# Patient Record
Sex: Female | Born: 1975 | Smoking: Former smoker
Health system: Southern US, Community
[De-identification: ages and names within clinical notes are randomized; demographics above are authoritative.]

## PROBLEM LIST (undated history)

## (undated) DIAGNOSIS — G4700 Insomnia, unspecified: Secondary | ICD-10-CM

## (undated) DIAGNOSIS — I351 Nonrheumatic aortic (valve) insufficiency: Secondary | ICD-10-CM

## (undated) DIAGNOSIS — Z8742 Personal history of other diseases of the female genital tract: Secondary | ICD-10-CM

## (undated) DIAGNOSIS — B373 Candidiasis of vulva and vagina: Secondary | ICD-10-CM

## (undated) DIAGNOSIS — G2581 Restless legs syndrome: Secondary | ICD-10-CM

## (undated) DIAGNOSIS — G4761 Periodic limb movement disorder: Secondary | ICD-10-CM

## (undated) DIAGNOSIS — F329 Major depressive disorder, single episode, unspecified: Secondary | ICD-10-CM

## (undated) DIAGNOSIS — I4711 Inappropriate sinus tachycardia, so stated: Secondary | ICD-10-CM

## (undated) DIAGNOSIS — F419 Anxiety disorder, unspecified: Secondary | ICD-10-CM

## (undated) DIAGNOSIS — K219 Gastro-esophageal reflux disease without esophagitis: Secondary | ICD-10-CM

## (undated) DIAGNOSIS — R000 Tachycardia, unspecified: Secondary | ICD-10-CM

## (undated) DIAGNOSIS — E559 Vitamin D deficiency, unspecified: Secondary | ICD-10-CM

## (undated) DIAGNOSIS — Z8659 Personal history of other mental and behavioral disorders: Secondary | ICD-10-CM

## (undated) DIAGNOSIS — N941 Unspecified dyspareunia: Secondary | ICD-10-CM

## (undated) DIAGNOSIS — E782 Mixed hyperlipidemia: Secondary | ICD-10-CM

## (undated) DIAGNOSIS — Z8616 Personal history of COVID-19: Secondary | ICD-10-CM

## (undated) DIAGNOSIS — I10 Essential (primary) hypertension: Secondary | ICD-10-CM

## (undated) DIAGNOSIS — G43909 Migraine, unspecified, not intractable, without status migrainosus: Secondary | ICD-10-CM

## (undated) DIAGNOSIS — F411 Generalized anxiety disorder: Secondary | ICD-10-CM

## (undated) DIAGNOSIS — E7800 Pure hypercholesterolemia, unspecified: Secondary | ICD-10-CM

## (undated) DIAGNOSIS — F32A Depression, unspecified: Secondary | ICD-10-CM

## (undated) DIAGNOSIS — N915 Oligomenorrhea, unspecified: Secondary | ICD-10-CM

## (undated) DIAGNOSIS — N390 Urinary tract infection, site not specified: Secondary | ICD-10-CM

## (undated) HISTORY — DX: Oligomenorrhea, unspecified: N91.5

## (undated) HISTORY — PX: OVARIAN CYST REMOVAL: SHX89

## (undated) HISTORY — DX: Vitamin D deficiency, unspecified: E55.9

## (undated) HISTORY — DX: Major depressive disorder, single episode, unspecified: F32.9

## (undated) HISTORY — DX: Essential (primary) hypertension: I10

## (undated) HISTORY — DX: Insomnia, unspecified: G47.00

## (undated) HISTORY — DX: Anxiety disorder, unspecified: F41.9

## (undated) HISTORY — DX: Candidiasis of vulva and vagina: B37.3

## (undated) HISTORY — DX: Depression, unspecified: F32.A

## (undated) HISTORY — DX: Personal history of other diseases of the female genital tract: Z87.42

## (undated) HISTORY — DX: Migraine, unspecified, not intractable, without status migrainosus: G43.909

## (undated) HISTORY — DX: Pure hypercholesterolemia, unspecified: E78.00

## (undated) HISTORY — DX: Urinary tract infection, site not specified: N39.0

---

## 1998-01-23 ENCOUNTER — Ambulatory Visit (HOSPITAL_COMMUNITY): Admission: RE | Admit: 1998-01-23 | Discharge: 1998-01-23 | Payer: Self-pay | Admitting: Obstetrics and Gynecology

## 1998-06-10 ENCOUNTER — Emergency Department (HOSPITAL_COMMUNITY): Admission: EM | Admit: 1998-06-10 | Discharge: 1998-06-10 | Payer: Self-pay | Admitting: Emergency Medicine

## 1998-10-24 ENCOUNTER — Encounter: Payer: Self-pay | Admitting: *Deleted

## 1998-10-24 ENCOUNTER — Inpatient Hospital Stay (HOSPITAL_COMMUNITY): Admission: AD | Admit: 1998-10-24 | Discharge: 1998-10-24 | Payer: Self-pay | Admitting: *Deleted

## 1998-12-23 ENCOUNTER — Inpatient Hospital Stay (HOSPITAL_COMMUNITY): Admission: AD | Admit: 1998-12-23 | Discharge: 1998-12-23 | Payer: Self-pay | Admitting: Obstetrics and Gynecology

## 1999-02-02 ENCOUNTER — Inpatient Hospital Stay (HOSPITAL_COMMUNITY): Admission: AD | Admit: 1999-02-02 | Discharge: 1999-02-02 | Payer: Self-pay | Admitting: Obstetrics and Gynecology

## 1999-02-08 ENCOUNTER — Inpatient Hospital Stay (HOSPITAL_COMMUNITY): Admission: AD | Admit: 1999-02-08 | Discharge: 1999-02-08 | Payer: Self-pay | Admitting: *Deleted

## 1999-05-20 ENCOUNTER — Inpatient Hospital Stay (HOSPITAL_COMMUNITY): Admission: AD | Admit: 1999-05-20 | Discharge: 1999-05-20 | Payer: Self-pay | Admitting: Obstetrics & Gynecology

## 1999-05-22 ENCOUNTER — Inpatient Hospital Stay (HOSPITAL_COMMUNITY): Admission: AD | Admit: 1999-05-22 | Discharge: 1999-05-22 | Payer: Self-pay | Admitting: *Deleted

## 1999-05-24 ENCOUNTER — Inpatient Hospital Stay (HOSPITAL_COMMUNITY): Admission: AD | Admit: 1999-05-24 | Discharge: 1999-05-29 | Payer: Self-pay | Admitting: *Deleted

## 1999-10-14 ENCOUNTER — Other Ambulatory Visit: Admission: RE | Admit: 1999-10-14 | Discharge: 1999-10-14 | Payer: Self-pay | Admitting: Obstetrics and Gynecology

## 2000-07-13 ENCOUNTER — Other Ambulatory Visit: Admission: RE | Admit: 2000-07-13 | Discharge: 2000-07-13 | Payer: Self-pay | Admitting: Obstetrics and Gynecology

## 2000-08-15 DIAGNOSIS — B373 Candidiasis of vulva and vagina: Secondary | ICD-10-CM

## 2000-08-15 DIAGNOSIS — N390 Urinary tract infection, site not specified: Secondary | ICD-10-CM

## 2000-08-15 HISTORY — DX: Candidiasis of vulva and vagina: B37.3

## 2000-08-15 HISTORY — DX: Urinary tract infection, site not specified: N39.0

## 2001-01-25 ENCOUNTER — Inpatient Hospital Stay (HOSPITAL_COMMUNITY): Admission: AD | Admit: 2001-01-25 | Discharge: 2001-01-28 | Payer: Self-pay | Admitting: Obstetrics and Gynecology

## 2001-01-25 ENCOUNTER — Encounter (INDEPENDENT_AMBULATORY_CARE_PROVIDER_SITE_OTHER): Payer: Self-pay | Admitting: Specialist

## 2001-03-19 ENCOUNTER — Inpatient Hospital Stay (HOSPITAL_COMMUNITY): Admission: AD | Admit: 2001-03-19 | Discharge: 2001-03-19 | Payer: Self-pay | Admitting: Obstetrics and Gynecology

## 2001-06-27 ENCOUNTER — Other Ambulatory Visit: Admission: RE | Admit: 2001-06-27 | Discharge: 2001-06-27 | Payer: Self-pay | Admitting: Obstetrics and Gynecology

## 2001-07-15 ENCOUNTER — Emergency Department (HOSPITAL_COMMUNITY): Admission: EM | Admit: 2001-07-15 | Discharge: 2001-07-16 | Payer: Self-pay | Admitting: Emergency Medicine

## 2002-07-10 ENCOUNTER — Other Ambulatory Visit: Admission: RE | Admit: 2002-07-10 | Discharge: 2002-07-10 | Payer: Self-pay | Admitting: Obstetrics and Gynecology

## 2003-07-14 ENCOUNTER — Other Ambulatory Visit: Admission: RE | Admit: 2003-07-14 | Discharge: 2003-07-14 | Payer: Self-pay | Admitting: Obstetrics and Gynecology

## 2004-07-14 ENCOUNTER — Other Ambulatory Visit: Admission: RE | Admit: 2004-07-14 | Discharge: 2004-07-14 | Payer: Self-pay | Admitting: Obstetrics and Gynecology

## 2005-07-18 ENCOUNTER — Other Ambulatory Visit: Admission: RE | Admit: 2005-07-18 | Discharge: 2005-07-18 | Payer: Self-pay | Admitting: Obstetrics and Gynecology

## 2006-01-17 ENCOUNTER — Ambulatory Visit: Payer: Self-pay | Admitting: Family Medicine

## 2006-02-27 ENCOUNTER — Ambulatory Visit: Payer: Self-pay | Admitting: Family Medicine

## 2006-04-14 ENCOUNTER — Ambulatory Visit: Payer: Self-pay | Admitting: Family Medicine

## 2006-06-15 ENCOUNTER — Ambulatory Visit: Payer: Self-pay | Admitting: Family Medicine

## 2006-08-15 DIAGNOSIS — E559 Vitamin D deficiency, unspecified: Secondary | ICD-10-CM

## 2006-08-15 HISTORY — DX: Vitamin D deficiency, unspecified: E55.9

## 2006-09-14 ENCOUNTER — Ambulatory Visit: Payer: Self-pay | Admitting: Family Medicine

## 2006-11-07 ENCOUNTER — Ambulatory Visit: Payer: Self-pay | Admitting: Family Medicine

## 2006-11-16 ENCOUNTER — Ambulatory Visit: Payer: Self-pay | Admitting: Family Medicine

## 2007-03-20 ENCOUNTER — Ambulatory Visit: Payer: Self-pay | Admitting: Family Medicine

## 2007-03-22 ENCOUNTER — Encounter: Admission: RE | Admit: 2007-03-22 | Discharge: 2007-03-22 | Payer: Self-pay | Admitting: Family Medicine

## 2007-08-16 DIAGNOSIS — G4700 Insomnia, unspecified: Secondary | ICD-10-CM

## 2007-08-16 HISTORY — DX: Insomnia, unspecified: G47.00

## 2007-08-21 ENCOUNTER — Ambulatory Visit: Payer: Self-pay | Admitting: Family Medicine

## 2007-10-08 ENCOUNTER — Encounter: Admission: RE | Admit: 2007-10-08 | Discharge: 2007-10-08 | Payer: Self-pay | Admitting: Specialist

## 2007-12-04 ENCOUNTER — Ambulatory Visit: Payer: Self-pay | Admitting: Family Medicine

## 2007-12-05 ENCOUNTER — Emergency Department (HOSPITAL_COMMUNITY): Admission: EM | Admit: 2007-12-05 | Discharge: 2007-12-06 | Payer: Self-pay | Admitting: Emergency Medicine

## 2007-12-06 ENCOUNTER — Ambulatory Visit: Payer: Self-pay | Admitting: Family Medicine

## 2007-12-10 ENCOUNTER — Ambulatory Visit: Payer: Self-pay | Admitting: Family Medicine

## 2007-12-11 ENCOUNTER — Encounter: Admission: RE | Admit: 2007-12-11 | Discharge: 2007-12-11 | Payer: Self-pay | Admitting: Family Medicine

## 2007-12-14 ENCOUNTER — Emergency Department (HOSPITAL_COMMUNITY): Admission: EM | Admit: 2007-12-14 | Discharge: 2007-12-15 | Payer: Self-pay | Admitting: Emergency Medicine

## 2007-12-24 ENCOUNTER — Encounter (INDEPENDENT_AMBULATORY_CARE_PROVIDER_SITE_OTHER): Payer: Self-pay | Admitting: Obstetrics and Gynecology

## 2007-12-24 ENCOUNTER — Observation Stay (HOSPITAL_COMMUNITY): Admission: AD | Admit: 2007-12-24 | Discharge: 2007-12-25 | Payer: Self-pay | Admitting: Obstetrics and Gynecology

## 2007-12-25 ENCOUNTER — Encounter (INDEPENDENT_AMBULATORY_CARE_PROVIDER_SITE_OTHER): Payer: Self-pay | Admitting: Obstetrics and Gynecology

## 2008-01-24 ENCOUNTER — Ambulatory Visit: Payer: Self-pay | Admitting: Family Medicine

## 2008-05-20 ENCOUNTER — Ambulatory Visit: Payer: Self-pay | Admitting: Family Medicine

## 2008-11-20 ENCOUNTER — Ambulatory Visit: Payer: Self-pay | Admitting: Family Medicine

## 2008-12-15 ENCOUNTER — Ambulatory Visit: Payer: Self-pay | Admitting: Family Medicine

## 2009-05-05 ENCOUNTER — Ambulatory Visit: Payer: Self-pay | Admitting: Family Medicine

## 2009-06-30 ENCOUNTER — Ambulatory Visit: Payer: Self-pay | Admitting: Family Medicine

## 2009-07-29 ENCOUNTER — Ambulatory Visit: Payer: Self-pay | Admitting: Family Medicine

## 2009-09-08 ENCOUNTER — Ambulatory Visit: Payer: Self-pay | Admitting: Family Medicine

## 2009-09-24 ENCOUNTER — Ambulatory Visit: Payer: Self-pay | Admitting: Family Medicine

## 2009-09-29 ENCOUNTER — Encounter: Admission: RE | Admit: 2009-09-29 | Discharge: 2009-09-29 | Payer: Self-pay | Admitting: Family Medicine

## 2009-10-13 ENCOUNTER — Ambulatory Visit: Payer: Self-pay | Admitting: Physician Assistant

## 2009-12-30 ENCOUNTER — Ambulatory Visit: Payer: Self-pay | Admitting: Family Medicine

## 2010-01-08 ENCOUNTER — Ambulatory Visit: Payer: Self-pay | Admitting: Family Medicine

## 2010-01-18 ENCOUNTER — Ambulatory Visit: Payer: Self-pay | Admitting: Family Medicine

## 2010-03-08 ENCOUNTER — Ambulatory Visit: Payer: Self-pay | Admitting: Family Medicine

## 2010-07-30 ENCOUNTER — Ambulatory Visit: Payer: Self-pay | Admitting: Family Medicine

## 2010-08-30 ENCOUNTER — Ambulatory Visit
Admission: RE | Admit: 2010-08-30 | Discharge: 2010-08-30 | Payer: Self-pay | Source: Home / Self Care | Attending: Family Medicine | Admitting: Family Medicine

## 2010-09-06 ENCOUNTER — Ambulatory Visit: Admit: 2010-09-06 | Payer: Self-pay | Admitting: Family Medicine

## 2010-12-28 NOTE — H&P (Signed)
NAMESHARIE, Veronica Gates NO.:  [id number]   MEDICAL RECORD NO.:  [id number]          PATIENT TYPE:  EMS   LOCATION:  MAJO                         FACILITY:  [facility]   PHYSICIAN:  Lucita Ferrara, MD         DATE OF BIRTH:  Jun 22, 1976   DATE OF ADMISSION:  12/05/2007  DATE OF DISCHARGE:                              HISTORY & PHYSICAL   PRIMARY CARE PHYSICIAN:  Sharlot Gowda, M.D.   HISTORY OF PRESENT ILLNESS:  The patient is a 35 year old female who  presents to [facility] with dizziness, chest pain,  nausea, tingling and numbness in the bilateral upper and lower  extremities.  She also states that she has got some dysphagia but no  sore throat, and although she can tolerate a regular diet currently, her  symptoms are nonspecific, somatic and generalized.  She denies any  shortness of breath, orthopnea, paroxysmal nocturnal dyspnea, lower  extremity edema.  She currently denies any chest pain.  She has a  history of migraine headaches that are well-controlled.  She attributes  some of her symptoms to an increased dose in her gabapentin.  She also  states that recently she has had a questionable urinary tract infection  which was found to be negative.  Physician's assistant apparently gave  her a dose of Bactrim.  She also attributes her symptoms to that.  Otherwise, her 12-point review of systems is negative.  She denies any  focal neurological deficits.  She denies any vomiting, changes in her  bowel habits, hematemesis, hematochezia.   PAST MEDICAL HISTORY:  1. Migraine headaches.  2. Hyperlipidemia.  3  Depression.   ALLERGIES:  FLOXIN.   MEDICATIONS AT HOME:  1. Gabapentin.  2. Lexapro.  3. Wellbutrin.  4. Tricor.  5. Simvastatin.  6. Vitamin D.   SURGERIES:  None   PHYSICAL EXAMINATION:  GENERAL:  The patient is a 35 year old female in  no acute distress.  VITAL SIGNS:  Blood pressure 130/92, respirations 18, pulse 81 and pulse  ox 100% on  room air.  HEENT:  Normocephalic, atraumatic.  Sclerae anicteric.  PERRLA.  Extraocular muscles intact.  NECK:  Supple.  No JVD or carotid bruits.  CARDIOVASCULAR:  S1-S2 regular rhythm.  No murmurs, rubs or clicks.  ABDOMEN:  Soft, nontender, nondistended.  Positive bowel sounds.  LUNGS:  Clear to auscultation bilaterally.  No rhonchi, rales or  wheezes.  EXTREMITIES:  No clubbing, cyanosis or edema.  NEUROLOGICAL:  Patient is alert and oriented x3, cranial nerves II-XII  grossly intact.  She had an EKG which showed normal sinus rhythm.  No ST-  T wave abnormalities.   LABORATORY RESULTS:  Urinalysis is essentially negative.  Urine  pregnancy test is negative.  Complete metabolic panel shows low  potassium 3.2, glucose 150, BUN 12, creatinine 1.31.  AST/ALT is 23/27  and alk-phos 33.  CBC shows a white count 7.4, hemoglobin 13.1,  hematocrit 37.4 platelets 253.   ASSESSMENT/PLAN:  Patient is a 35 year old with:  1. Chest pain that is really atypical with no EKG changes.  2. Dizziness,  nausea with no vomiting.  3. Diffuse numbness and tingling all over the body  4. History of a migraine headaches.  5. Recent diagnosis of urinary tract infection with a negative      urinalysis.  6. Hyperglycemia.  7. Depression.  8. Hyperlipidemia.   PLAN:  Note decision made by ED physician to DC patient home, given  atypical presentation for CP.      Lucita Ferrara, MD  Electronically Signed     RR/MEDQ  D:  12/06/2007  T:  12/06/2007  Job:  [id number]

## 2010-12-28 NOTE — Op Note (Signed)
NAMELASHANTE, Veronica Gates             ACCOUNT NO.:  [id number]   MEDICAL RECORD NO.:  [id number]          PATIENT TYPE:  OBV   LOCATION:  9309                          FACILITY:  [facility]   PHYSICIAN:  Crist Fat. Rivard, M.D. DATE OF BIRTH:  [birth year]   DATE OF PROCEDURE:  DATE OF DISCHARGE:                               OPERATIVE REPORT   PREOPERATIVE DIAGNOSIS:  Right ovarian cyst with severe pelvic pain.   POSTOPERATIVE DIAGNOSIS:  Right ovarian cyst with severe pelvic pain.   ANESTHESIA:  General.   ANESTHESIOLOGIST:  Dr. Malen Gauze.   PROCEDURE:  Laparoscopy and right ovarian cystectomy.   SURGEON:  Crist Fat. Rivard, M.D.   ASSISTANT:  Cam Hai, C.N.M., certified nurse midwife.   ESTIMATED BLOOD LOSS:  Minimal.   PROCEDURE:  After being informed of the planned procedure with possible  complications including bleeding, infection, injury to bowel, bladder,  ureters, need for laparotomy, and need for oophorectomy.  Informed  consent was obtained.  The patient is taken to OR #4.  Given general  anesthesia with endotracheal intubation without any complication.  She  was placed in the lithotomy position.  Prepped and draped in a sterile  fashion and a Foley catheter is inserted in her bladder.  Pelvic exam  reveals an anteverted uterus normal in size and shape.  A normal left  adnexa and an enlarged mobile right adnexa.  A speculum is inserted.  Anterior lip of the cervix was grasped with tenaculum.  Forceps and an  acorn manipulator was placed in the cervix.  The speculum is removed.   We infiltrate the umbilical area with Marcaine 0.255 mL and perform a  semielliptical incision, which was brought down sharply to the fascia.  The fascia is identified, grasped with Kocher forceps, and incised with  Mayo scissors.  Peritoneum is entered bluntly.  A purse-string suture of  0-Vicryl is placed on the fascia and a 10-mm Hassan trocar is easily  inserted in the incision held in place  with a purse-string suture  previously placed.  This allowed for easy insufflation of  pneumoperitoneum with CO2 at a maximum pressure of 15 mmHg.  A camera is  inserted.  The patient is placed in Trendelenburg and we can now  visualize an anterior cul-de-sac, which is essentially normal except for  some adhesions between the bladder and the lower uterine segment.  A  normal uterus and normal posterior cul-de-sac.  Both tubes appear normal  except for previous site of tubal ligation.  Left ovary is normal.  Right ovary is enlarged with a thin-wall cyst measuring 5-1/2 to 6 cm.  Mobile with no excrescence and no adhesions.  Appendix is not  visualized.  Omentum is visualized and normal.  Liver and gallbladder  are normal.  A little bit of free pelvic fluid clear, is present.  We do  determine placement of 2 trocars.  A 10-mm suprapubic trocar and a 5-mm  right lower quadrant trocar under direct visualization after  infiltrating with Marcaine 0.25.  We then performed pelvic washings.   The right utero-ovarian ligament is grasped and the ovary  is slipped on  top of the uterus, which shows this clearly the site of the thin wall.  Using monopolar scissors, we can make a little breech in the external  capsule of the cyst, and bluntly dissect the cyst away for easy  grasping.  Using a two graspers technique and traction countertraction,  we are able to peel this is completely away from the ovary, which does  release some spillage of clear fluid.  The cyst is removed in its  entirety and sent for pathology.  We then profusely irrigated with warm  saline and note a few sites of oozing at the base of the cyst, which are  easily controlled using bipolar cauterization.  We again profusely  washed and noticed satisfactory hemostasis even after an underwater view  as well as deflating the abdomen to release the pneumoperitoneum.  Instruments are then removed after evacuating the pneumoperitoneum.    The umbilical fascia is closed with the previously placed purse-string  suture of 0-Vicryl and the suprapubic fascia is closed with a figure-of-  eight stitch of 0-Vicryl.  The skin of all incisions is then closed with  subcuticular suture of 3-0 Monocryl and Dermabond.  The intrauterine  manipulator and the tenaculum are removed as well as the Foley catheter.   Instruments and sponge count is complete x2.  Estimated blood loss is  minimal.  The procedure is very well tolerated by the patient who was  taken to recovery room in a well and stable condition.   SPECIMEN:  Pelvic washings sent to cytology and right ovarian cyst wall  sent to pathology.      Crist Fat Rivard, M.D.  Electronically Signed     SAR/MEDQ  D:  12/24/2007  T:  12/25/2007  Job:  [id number]

## 2010-12-31 NOTE — Op Note (Signed)
[facility]  Patient:    Veronica Gates, Veronica Gates                    MRN: [id number] Proc. Date: 01/25/01 Adm. Date:  [id number] Attending:  Dierdre Forth Pearline                           Operative Report  PREOPERATIVE DIAGNOSES:       1. Term intrauterine pregnancy.                               2. Prior cesarean section.                               3. Desires for repeat.                               4. Desire for surgical sterilization.  POSTOPERATIVE DIAGNOSES:      1. Term intrauterine pregnancy.                               2. Prior cesarean section.                               3. Desires for repeat.                               4. Desire for surgical sterilization.                               5. Macrosomia.  SURGERY:                      Repeat low transverse cesarean section and bilateral tubal sterilization.  SURGEON:                      Vanessa P. Pennie Rushing, M.D.  FIRST ASSISTANT:              Marcelle Smiling. Clelia Croft, C.N.M.  ANESTHESIA:                   Spinal.  ESTIMATED BLOOD LOSS:         750 cc.  COMPLICATIONS:                None.  FINDINGS:                     The patient was delivered of a female infant, whose name is Veronica Gates, weighing 9 lb 14 oz with Apgars of 8 and 9 at one and five minutes respectively.  The uterus, tubes and ovaries were normal for the gravid state.  DESCRIPTION OF PROCEDURE:     The patient was taken to the operating room after appropriate identification and placed on the operating table.  After attaining adequate spinal anesthesia, she was placed in the supine position with a left lateral tilt.  The abdomen and perineum were prepped with multiple layers of Betadine and a Foley catheter inserted into the bladder and connected to straight drainage.  The abdomen was draped as a sterile field. After assurance of adequate anesthesia, a transverse incision was made at the site of the previous  cesarean section incision and the abdomen opened in layers.  The peritoneum was entered.  The uterus was incised approximately 1 cm above the uterovesical fold and the incision enlarged bluntly.  The infant was delivered from the occiput transverse position with the aid of a Kiwi vacuum extractor and, after having the nares and pharynx suctioned and the cord clamped and cut, was handed off to the awaiting pediatricians.  The appropriate cord blood was drawn and the placenta noted to have separated from the uterus.  It was then removed and the uterine incision closed with a running interlocking suture of 0 Vicryl.  An imbricating suture of 0 Vicryl was placed.  Hemostatic sutures of 0 Vicryl allowed for adequate hemostasis. The left fallopian tube was then identified, followed to its fimbriated end, the grasped at the isthmic portion and elevated.  A suture of 2-0 chromic was placed through the mesosalpinx and tied fore and aft on a knuckle of tube.  A second suture ligature was placed proximal to that and the intervening knuckle of tube excised.  The cut ends were cauterized.  A similar procedure was carried out on the opposite side and hemostasis noted to be adequate.  A single suture of 2-0 Vicryl was placed in the uterine serosa to repair that incision and copious irrigation carried out with adequate hemostasis noted. The abdominal peritoneum was then closed with a running suture of 2-0 Vicryl. The rectus muscles were reapproximated in the midline with a figure-of-eight suture of 2-0 Vicryl.  The rectus muscles were noted to be hemostatic and irrigated.  The rectus fascia was closed with a running suture of 0 Vicryl and then reinforced on either side of midline with figure-of-eight sutures of 0 Vicryl.  The subcutaneous tissue was irrigated and made hemostatic with Bovie cautery.  Skin staples were applied to the skin incision.  A sterile dressing was applied.  The patient was taken from  the operating room to the recovery room in satisfactory condition, having tolerated the procedure well, with sponge and instrument counts correct.  The infant was taken to the full-term nursery. DD:  01/25/01 TD:  01/25/01 Job: [id number]
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
## 2010-12-31 NOTE — Discharge Summary (Signed)
[facility]  Patient:    Veronica Gates, Veronica Gates                    MRN: [id number] Adm. Date:  [id number] Disc. Date: 01/28/01 Attending:  Shaune Spittle Dictator:   Mack Guise, C.N.M.                           Discharge Summary  ADMISSION DIAGNOSIS:          Term pregnancy; desires repeat C-section, desires sterilization.  DISCHARGE DIAGNOSES:          1. Term pregnancy delivered primary low                                  transverse cesarean delivery.                               2. Postpartum bilateral tubal sterilization.  PROCEDURES:                   1. Primary low transverse cesarean delivery.                               2. Bilateral tubal sterilization.  HISTORY OF PRESENT ILLNESS:   Ms. Asante is a 35 year old gravida 3, para 2-0-0-2 who presents for elective repeat C-section with increasingly labile blood pressures.  She underwent primary low transverse cesarean delivery with the birth of an 8 pound 14 ounce female infant named Earna Coder with Apgar scores of 8 at one minute and 9 at five minutes by Dr. Dierdre Forth. Her hemoglobin on the first postop day was 9.8 and her blood pressures have remained stable.  This is her third postop day and she is judged to be in satisfactory condition for discharge.  DISCHARGE MEDICATIONS:        1. Motrin 600 mg p.o. q.6h. p.r.n. pain.                               2. Tylox 1-2 p.o. q.3-4h. pain.                               3. Prenatal vitamins.  FOLLOW-UP:                    Follow-up at [facility] in six weeks. DD:  01/28/01 TD:  01/28/01 Job: [id number]

## 2011-05-10 LAB — DIFFERENTIAL
Basophils Absolute: 0
Basophils Relative: 0
Eosinophils Absolute: 0.1
Eosinophils Relative: 1
Lymphocytes Relative: 28
Lymphs Abs: 2.1
Monocytes Absolute: 0.6
Monocytes Relative: 8
Neutro Abs: 4.7
Neutrophils Relative %: 63

## 2011-05-10 LAB — URINALYSIS, ROUTINE W REFLEX MICROSCOPIC
Bilirubin Urine: NEGATIVE
Glucose, UA: NEGATIVE
Hgb urine dipstick: NEGATIVE
Ketones, ur: NEGATIVE
Nitrite: NEGATIVE
Protein, ur: NEGATIVE
Specific Gravity, Urine: 1.015
Urobilinogen, UA: 0.2
pH: 7.5

## 2011-05-10 LAB — CBC
HCT: 37.4
Hemoglobin: 13.1
MCHC: 35
MCV: 92.2
Platelets: 253
RBC: 4.06
RDW: 12.1
WBC: 7.4

## 2011-05-10 LAB — PREGNANCY, URINE: Preg Test, Ur: NEGATIVE

## 2011-05-10 LAB — COMPREHENSIVE METABOLIC PANEL
ALT: 27
AST: 23
Albumin: 3.9
Alkaline Phosphatase: 33 — ABNORMAL LOW
Calcium: 9.7
GFR calc Af Amer: 57 — ABNORMAL LOW
Glucose, Bld: 150 — ABNORMAL HIGH
Potassium: 3.2 — ABNORMAL LOW
Sodium: 139
Total Bilirubin: 0.4
Total Protein: 6.3

## 2011-05-10 LAB — COMPREHENSIVE METABOLIC PANEL WITH GFR
BUN: 12
CO2: 25
Chloride: 104
Creatinine, Ser: 1.31 — ABNORMAL HIGH
GFR calc non Af Amer: 47 — ABNORMAL LOW

## 2011-05-11 LAB — CBC
HCT: 35 — ABNORMAL LOW
HCT: 36.5
Hemoglobin: 12.1
Hemoglobin: 12.5
MCHC: 34.3
MCHC: 34.6
MCV: 92.3
MCV: 92.7
Platelets: 251
Platelets: 280
RBC: 3.77 — ABNORMAL LOW
RBC: 3.95
RDW: 11.9
RDW: 12.1
WBC: 10.3
WBC: 8.1

## 2012-01-19 ENCOUNTER — Encounter: Payer: Self-pay | Admitting: Obstetrics and Gynecology

## 2012-01-19 ENCOUNTER — Ambulatory Visit (INDEPENDENT_AMBULATORY_CARE_PROVIDER_SITE_OTHER): Payer: 59 | Admitting: Obstetrics and Gynecology

## 2012-01-19 VITALS — BP 122/80 | Ht 70.0 in | Wt 193.0 lb

## 2012-01-19 DIAGNOSIS — Z01419 Encounter for gynecological examination (general) (routine) without abnormal findings: Secondary | ICD-10-CM

## 2012-01-19 DIAGNOSIS — N912 Amenorrhea, unspecified: Secondary | ICD-10-CM

## 2012-01-19 DIAGNOSIS — N926 Irregular menstruation, unspecified: Secondary | ICD-10-CM | POA: Insufficient documentation

## 2012-01-19 LAB — POCT URINE PREGNANCY: Preg Test, Ur: NEGATIVE

## 2012-01-19 MED ORDER — MEDROXYPROGESTERONE ACETATE 5 MG PO TABS: ORAL_TABLET | ORAL | Status: DC

## 2012-01-19 NOTE — Progress Notes (Signed)
Last Pap: 2011 due 2014 WNL: Yes Regular Periods:no Contraception: none  Monthly Breast exam:yes Tetanus<2yrs:yes Nl.Bladder Function:yes Daily BMs:yes Healthy Diet:yes Calcium:yes Mammogram:no Exercise:yes Seatbelt: yes Abuse at home: no Stressful work:no Sigmoid-colonoscopy: n/a Bone Density: No  Pt states no cycle since 12/12 pt absolutely certain she is not pregnant.  Has ling hx of irregular cycles  Subjective:    Veronica Gates is a 36 y.o. female, No obstetric history on file., who presents for an annual exam.     History   Social History  . Marital Status: Married    Spouse Name: N/A    Number of Children: N/A  . Years of Education: N/A   Social History Main Topics  . Smoking status: Not on file  . Smokeless tobacco: Not on file  . Alcohol Use: Not on file  . Drug Use: Not on file  . Sexually Active: Not on file   Other Topics Concern  . Not on file   Social History Narrative  . No narrative on file    Menstrual cycle:   LMP: Patient's last menstrual period was 07/21/2011.           Cycle: monthly for the 12 months of 2012.  None since 12/12.  No significant abdominal pain. The following portions of the patient's history were reviewed and updated as appropriate: allergies, current medications, past family history, past medical history, past social history, past surgical history and problem list.  Review of Systems Pertinent items are noted in HPI. Breast:Negative for breast lump,nipple discharge or nipple retraction Gastrointestinal: Negative for abdominal pain, change in bowel habits or rectal bleeding Urinary:negative Has hx of ovarian cysts in the past Had nl TSH with PCP in March   Objective:    BP 122/80  Ht 5\' 10"  (1.778 m)  Wt 193 lb (87.544 kg)  BMI 27.69 kg/m2  LMP 07/21/2011    Weight:  Wt Readings from Last 1 Encounters:  01/19/12 193 lb (87.544 kg) Wt 1188 last year          BMI: Body mass index is 27.69 kg/(m^2).  General  Appearance: Alert, appropriate appearance for age. No acute distress HEENT: Grossly normal Neck / Thyroid: Supple, no masses, nodes or enlargement Lungs: clear to auscultation bilaterally Back: No CVA tenderness Breast Exam: No masses or nodes.No dimpling, nipple retraction or discharge. Cardiovascular: Regular rate and rhythm. S1, S2, no murmur Gastrointestinal: Soft, non-tender, no masses or organomegaly Pelvic Exam: External genitalia: normal general appearance Vaginal: normal mucosa without prolapse or lesions Cervix: normal appearance Adnexa: fullness in Right adnexum,. ? cystic.  Left no masses Uterus: ULNS Rectovaginal: normal rectal, no masses Lymphatic Exam: Non-palpable nodes in neck, clavicular, axillary, or inguinal regions Skin: no rash or abnormalities Neurologic: Normal gait and speech, no tremor  Psychiatric: Alert and oriented, appropriate affect.   Wet Prep:not applicable Urinalysis:not applicable UPT: Pt will perform at home, Not done   Assessment:    history of irregular menses, now on menorrhea it for 6 months History of ovarian cyst with fullness in the right adnexum Verbal report of borderline thyroid study Difficulty maintaining weight loss  Plan:    Pap smear is due in 2014 Ultrasound to rule out ovarian cyst.  followup at that Provera 5 mg p.o. Q.d. For 5 days to be used at any time of 3 months or more of menorrhea after doing a pregnancy test at home} STD screening: declined Contraception:bilateral tubal ligation Release of information for the thyroid studies done  by PCP      [facility] PMD

## 2012-01-19 NOTE — Patient Instructions (Signed)
Provera 5 mg by mouth daily for 5 days. Call if no menses within 2 weeks of completing Provera. Repeat Provera dose after 3 months of no cycle, once a home pregnancy test has come back negative

## 2012-02-09 ENCOUNTER — Other Ambulatory Visit: Payer: Self-pay | Admitting: Obstetrics and Gynecology

## 2012-02-09 ENCOUNTER — Ambulatory Visit (INDEPENDENT_AMBULATORY_CARE_PROVIDER_SITE_OTHER): Payer: Commercial Indemnity | Admitting: Obstetrics and Gynecology

## 2012-02-09 ENCOUNTER — Encounter: Payer: Self-pay | Admitting: Obstetrics and Gynecology

## 2012-02-09 ENCOUNTER — Ambulatory Visit (INDEPENDENT_AMBULATORY_CARE_PROVIDER_SITE_OTHER): Payer: Commercial Indemnity

## 2012-02-09 VITALS — BP 100/70 | Temp 99.4°F | Ht 69.34 in | Wt 190.0 lb

## 2012-02-09 DIAGNOSIS — K5900 Constipation, unspecified: Secondary | ICD-10-CM

## 2012-02-09 DIAGNOSIS — N912 Amenorrhea, unspecified: Secondary | ICD-10-CM

## 2012-02-09 DIAGNOSIS — R140 Abdominal distension (gaseous): Secondary | ICD-10-CM

## 2012-02-09 DIAGNOSIS — R141 Gas pain: Secondary | ICD-10-CM

## 2012-02-09 DIAGNOSIS — N926 Irregular menstruation, unspecified: Secondary | ICD-10-CM

## 2012-02-09 MED ORDER — PROGESTERONE MICRONIZED 200 MG PO CAPS: 200.0000 mg | ORAL_CAPSULE | Freq: Every day | ORAL | Status: DC

## 2012-02-09 NOTE — Patient Instructions (Signed)
OTC Miralax daily for constipation

## 2012-02-09 NOTE — Progress Notes (Signed)
Subjective: Pt c/o a continuous dull aching pain under her right ribcage since 03/07/12. Pt c/o severe nausea daily since. Pt took Ibuprofen 800mg  x 1 dose on 03/07/12 without improvement. C/o occ difficulty breathing due to pain.  The patient has chronic constipation and has had significant abdominal bloating over of the time frame during which she has not had her menses. Withdrawal menses after Provera LMP-01/28/12 x 1 day only.  Objective: BP 100/70  Temp 99.4 F (37.4 C)  Ht 5' 9.34" (1.761 m)  Wt 190 lb (86.183 kg)  BMI 27.78 kg/m2  LMP 01/28/2012 The abdomen is soft without masses or organomegaly or tenderness Ultrasound: The uterus measures 9.14 x 5.06 x 6.07 cm. The endometrium is 7 mm. A C-section scar is noted. There were no uterine masses the right ovary is within normal limits with the appearance of a resolving cyst. The left ovary is within normal limits and since high in the left adnexum near the uterus. There is a small amount of cul-de-sac fluid which could be considered within normal limits.  Impression: Long history of oligomenorrhea. Minimal response to 5 days of Provera Abdominal bloating. Chronic constipation.  Recommendation: MiraLax daily to improve bowel function Prometrium 200 mg at bedtime for the next 14 days starting on July 1 Return to office in 8 weeks to evaluate GI symptoms and menses

## 2012-02-10 DIAGNOSIS — K5900 Constipation, unspecified: Secondary | ICD-10-CM | POA: Insufficient documentation

## 2012-02-10 DIAGNOSIS — R140 Abdominal distension (gaseous): Secondary | ICD-10-CM | POA: Insufficient documentation

## 2012-04-03 DIAGNOSIS — N390 Urinary tract infection, site not specified: Secondary | ICD-10-CM | POA: Insufficient documentation

## 2012-04-03 DIAGNOSIS — B3731 Acute candidiasis of vulva and vagina: Secondary | ICD-10-CM | POA: Insufficient documentation

## 2012-04-03 DIAGNOSIS — N915 Oligomenorrhea, unspecified: Secondary | ICD-10-CM | POA: Insufficient documentation

## 2012-04-03 DIAGNOSIS — F329 Major depressive disorder, single episode, unspecified: Secondary | ICD-10-CM | POA: Insufficient documentation

## 2012-04-03 DIAGNOSIS — B373 Candidiasis of vulva and vagina: Secondary | ICD-10-CM | POA: Insufficient documentation

## 2012-04-03 DIAGNOSIS — G43909 Migraine, unspecified, not intractable, without status migrainosus: Secondary | ICD-10-CM | POA: Insufficient documentation

## 2012-04-03 DIAGNOSIS — I10 Essential (primary) hypertension: Secondary | ICD-10-CM | POA: Insufficient documentation

## 2012-04-03 DIAGNOSIS — Z8742 Personal history of other diseases of the female genital tract: Secondary | ICD-10-CM | POA: Insufficient documentation

## 2012-04-03 DIAGNOSIS — F32A Depression, unspecified: Secondary | ICD-10-CM | POA: Insufficient documentation

## 2012-04-03 DIAGNOSIS — F419 Anxiety disorder, unspecified: Secondary | ICD-10-CM | POA: Insufficient documentation

## 2012-04-03 DIAGNOSIS — G4700 Insomnia, unspecified: Secondary | ICD-10-CM | POA: Insufficient documentation

## 2012-04-03 DIAGNOSIS — E559 Vitamin D deficiency, unspecified: Secondary | ICD-10-CM | POA: Insufficient documentation

## 2012-04-03 DIAGNOSIS — E7800 Pure hypercholesterolemia, unspecified: Secondary | ICD-10-CM | POA: Insufficient documentation

## 2012-04-05 ENCOUNTER — Ambulatory Visit (INDEPENDENT_AMBULATORY_CARE_PROVIDER_SITE_OTHER): Payer: Commercial Indemnity | Admitting: Obstetrics and Gynecology

## 2012-04-05 ENCOUNTER — Encounter: Payer: Self-pay | Admitting: Obstetrics and Gynecology

## 2012-04-05 VITALS — BP 112/72 | Ht 70.0 in | Wt 193.0 lb

## 2012-04-05 DIAGNOSIS — N926 Irregular menstruation, unspecified: Secondary | ICD-10-CM

## 2012-04-05 DIAGNOSIS — N912 Amenorrhea, unspecified: Secondary | ICD-10-CM

## 2012-04-05 MED ORDER — PROGESTERONE MICRONIZED 200 MG PO CAPS: 200.0000 mg | ORAL_CAPSULE | Freq: Every day | ORAL | Status: DC

## 2012-04-05 NOTE — Progress Notes (Signed)
GYN PROBLEM VISIT  [name] is a 36 y.o. year old female,G3P3, who presents for a problem visit.   Subjective:  Pt here to f/u on GI sx and menses. Pt states that she is not having GI issues anymore. Also began cycle on 03/31/2012 as a spontaneous cycle.  Had withdrawal from Prometrium in July.  Still has only weekly BMs, but doesn't want to take daily Miralax. Declines pelvic exam.  Objective:  BP 112/72  Ht 5\' 10"  (1.778 m)  Wt 193 lb (87.544 kg)  BMI 27.69 kg/m2  LMP 03/31/2012; continues through today    Assessment: Oligomenorrhea probably secondary to PCOS variant GI bloating from combination of oligomenorrhea and constipation  Plan: Prometrium every 6 weeks prn amenorrhea Miralax prn constipation Followup Jan 2014 for aex Will do TSH then if not done in last 5 years by PCP ROI for labs from PCP.       Dierdre Forth, MD  04/05/2012 8:38 AM

## 2012-07-17 ENCOUNTER — Telehealth: Payer: Self-pay | Admitting: Obstetrics and Gynecology

## 2012-07-18 NOTE — Telephone Encounter (Signed)
CHANDRA, I SENT [facility] A NOTE OF THIS PT TO SEE WHAT TO DO ABOUT AMENORRHEA. PLEASE MAKE SURE SHE SEES MESSAGE.

## 2012-07-25 ENCOUNTER — Telehealth: Payer: Self-pay

## 2012-07-25 DIAGNOSIS — N912 Amenorrhea, unspecified: Secondary | ICD-10-CM

## 2012-07-25 NOTE — Telephone Encounter (Signed)
Tc to pt per message rgdg amenorrhea after taking Progsterone as pres. Consulted with [facility], pt to have FSH and Estradiol done. Will follow up with pt for POC after labs reviewed. Pt voices understanding. Appt sched 07/27/12 @ 2:00p with lab only.

## 2012-07-27 ENCOUNTER — Other Ambulatory Visit: Payer: Commercial Indemnity

## 2012-07-27 DIAGNOSIS — N912 Amenorrhea, unspecified: Secondary | ICD-10-CM

## 2012-07-28 LAB — FOLLICLE STIMULATING HORMONE: FSH: 6.8 m[IU]/mL

## 2012-07-28 LAB — ESTRADIOL: Estradiol: 43.7 pg/mL

## 2012-08-14 ENCOUNTER — Encounter: Payer: Commercial Indemnity | Admitting: Obstetrics and Gynecology

## 2012-09-13 ENCOUNTER — Ambulatory Visit: Payer: Commercial Indemnity | Admitting: Obstetrics and Gynecology

## 2012-09-13 ENCOUNTER — Encounter: Payer: Self-pay | Admitting: Obstetrics and Gynecology

## 2012-09-13 VITALS — BP 106/64 | Temp 99.1°F | Wt 194.0 lb

## 2012-09-13 DIAGNOSIS — N390 Urinary tract infection, site not specified: Secondary | ICD-10-CM

## 2012-09-13 DIAGNOSIS — R140 Abdominal distension (gaseous): Secondary | ICD-10-CM

## 2012-09-13 DIAGNOSIS — N912 Amenorrhea, unspecified: Secondary | ICD-10-CM

## 2012-09-13 DIAGNOSIS — R399 Unspecified symptoms and signs involving the genitourinary system: Secondary | ICD-10-CM

## 2012-09-13 DIAGNOSIS — N915 Oligomenorrhea, unspecified: Secondary | ICD-10-CM

## 2012-09-13 LAB — POCT URINALYSIS DIPSTICK
Bilirubin, UA: NEGATIVE
Glucose, UA: NEGATIVE
Ketones, UA: NEGATIVE
Nitrite, UA: POSITIVE
Protein, UA: NEGATIVE
Spec Grav, UA: 1.01
Urobilinogen, UA: NEGATIVE
pH, UA: 5

## 2012-09-13 MED ORDER — SULFAMETHOXAZOLE-TRIMETHOPRIM 800-160 MG PO TABS: 1.0000 | ORAL_TABLET | Freq: Two times a day (BID) | ORAL | Status: AC

## 2012-09-13 MED ORDER — NITROFURANTOIN MACROCRYSTAL 50 MG PO CAPS: 50.0000 mg | ORAL_CAPSULE | Freq: Every evening | ORAL | Status: DC | PRN

## 2012-09-13 NOTE — Progress Notes (Signed)
GYN PROBLEM VISIT  [name] is a 37 y.o. year old female,G3P3, who presents for a problem visit.   Subjective: No menses after taking provera for 5 days in the fall or prometrium for 12 days in November.  Had spontaneous menses 08/27/12 lasting 6 days of heavy bleeding without cramps.  Pt c/o of abdominal bloating and wt gain during times of prolonged amenorrhea, with relief of all sx once menses begin. Now has 1 wk hx of dysuria.  Has had recurrent UTI's associated with intercourse  Objective:  BP 106/64  Wt 194 lb (87.998 kg)  LMP 08/27/2012   GI: soft, non-tender; bowel sounds normal; no masses,  no organomegaly  Vulva and vagina appear normal. Bimanual exam reveals normal uterus and adnexa.  U/A:  Positive nitrites  Assessment: UTI Recurrent UTIs  Oligomenorrhea, now s/p spontaneous menses  Plan: If no menses by 11/27/12, pt to take Provera for 5 days.  If no menses, she is to call for U/S to look at endometrial thickness Septra D/S for 3 days Urine c&s Nitrofurantoin 50 mg hs prn intercourse F/U 5/14 for aex or prn   Dierdre Forth, MD  09/13/2012 10:45 AM

## 2012-09-15 LAB — URINE CULTURE: Colony Count: >=100000

## 2012-09-17 ENCOUNTER — Other Ambulatory Visit: Payer: Self-pay

## 2012-09-17 DIAGNOSIS — N390 Urinary tract infection, site not specified: Secondary | ICD-10-CM

## 2012-10-08 ENCOUNTER — Other Ambulatory Visit: Payer: Commercial Indemnity

## 2012-10-09 ENCOUNTER — Other Ambulatory Visit: Payer: Commercial Indemnity

## 2012-10-09 DIAGNOSIS — N390 Urinary tract infection, site not specified: Secondary | ICD-10-CM

## 2012-10-11 LAB — URINE CULTURE
Colony Count: NO GROWTH
Organism ID, Bacteria: NO GROWTH

## 2014-02-04 ENCOUNTER — Other Ambulatory Visit: Payer: Self-pay | Admitting: Obstetrics and Gynecology

## 2014-02-04 DIAGNOSIS — Z1231 Encounter for screening mammogram for malignant neoplasm of breast: Secondary | ICD-10-CM

## 2014-05-26 ENCOUNTER — Ambulatory Visit
Admission: RE | Admit: 2014-05-26 | Discharge: 2014-05-26 | Disposition: A | Discharge status: Home / Self Care | Payer: BC Managed Care – PPO | Source: Ambulatory Visit | Attending: Obstetrics and Gynecology | Admitting: Obstetrics and Gynecology

## 2014-05-26 DIAGNOSIS — Z1231 Encounter for screening mammogram for malignant neoplasm of breast: Secondary | ICD-10-CM

## 2014-06-16 ENCOUNTER — Encounter: Payer: Self-pay | Admitting: Obstetrics and Gynecology

## 2015-10-01 ENCOUNTER — Institutional Professional Consult (permissible substitution): Payer: Self-pay | Admitting: Internal Medicine

## 2015-10-01 ENCOUNTER — Encounter: Payer: Self-pay | Admitting: Internal Medicine

## 2015-10-01 ENCOUNTER — Ambulatory Visit (INDEPENDENT_AMBULATORY_CARE_PROVIDER_SITE_OTHER): Payer: BLUE CROSS/BLUE SHIELD | Admitting: Internal Medicine

## 2015-10-01 VITALS — BP 120/90 | HR 75 | Ht 69.5 in | Wt 172.0 lb

## 2015-10-01 DIAGNOSIS — R05 Cough: Secondary | ICD-10-CM

## 2015-10-01 DIAGNOSIS — R058 Other specified cough: Secondary | ICD-10-CM

## 2015-10-01 DIAGNOSIS — K219 Gastro-esophageal reflux disease without esophagitis: Secondary | ICD-10-CM | POA: Insufficient documentation

## 2015-10-01 MED ORDER — PANTOPRAZOLE SODIUM 40 MG PO TBEC: 40.0000 mg | DELAYED_RELEASE_TABLET | Freq: Every day | ORAL | Status: DC

## 2015-10-01 MED ORDER — FAMOTIDINE 20 MG PO TABS: ORAL_TABLET | ORAL | Status: DC

## 2015-10-01 MED ORDER — PREDNISONE 10 MG PO TABS: ORAL_TABLET | ORAL | Status: DC

## 2015-10-01 MED ORDER — TRAMADOL HCL 50 MG PO TABS: ORAL_TABLET | ORAL | Status: DC

## 2015-10-01 MED ORDER — ACETAMINOPHEN-CODEINE #3 300-30 MG PO TABS: ORAL_TABLET | ORAL | Status: DC

## 2015-10-01 NOTE — Assessment & Plan Note (Signed)
The most common causes of chronic cough in immunocompetent adults include the following: upper airway cough syndrome (UACS), previously referred to as postnasal drip syndrome (PNDS), which is caused by variety of rhinosinus conditions; (2) asthma; (3) GERD; (4) chronic bronchitis from cigarette smoking or other inhaled environmental irritants; (5) nonasthmatic eosinophilic bronchitis; and (6) bronchiectasis.   These conditions, singly or in combination, have accounted for up to 94% of the causes of chronic cough in prospective studies.   Other conditions have constituted no >6% of the causes in prospective studies These have included bronchogenic carcinoma, chronic interstitial pneumonia, sarcoidosis, left ventricular failure, ACEI-induced cough, and aspiration from a condition associated with pharyngeal dysfunction.    Chronic cough is often simultaneously caused by more than one condition. A single cause has been found from 38 to 82% of the time, multiple causes from 18 to 62%. Multiply caused cough has been the result of three diseases up to 42% of the time.       Based on hx and exam, this is most likely:  Classic Upper airway cough syndrome, so named because it's frequently impossible to sort out how much is  CR/sinusitis with freq throat clearing (which can be related to primary GERD)   vs  causing  secondary (" extra esophageal")  GERD from wide swings in gastric pressure that occur with throat clearing, often  promoting self use of mint and menthol lozenges that reduce the lower esophageal sphincter tone and exacerbate the problem further in a cyclical fashion.   These are the same pts (now being labeled as having "irritable larynx syndrome" by some cough centers) who not infrequently have a history of having failed to tolerate ace inhibitors,  dry powder inhalers or biphosphonates or report having atypical reflux symptoms that don't respond to standard doses of PPI , and are easily confused as  having aecopd or asthma flares by even experienced allergists/ pulmonologists.   The first step is to maximize acid suppression and eliminate cyclical coughing then regroup if the cough persists.  I had an extended discussion with the patient reviewing all relevant studies completed to date and  lasting 35/60  min  1) Explained: The standardized cough guidelines published in Chest by Richard Irwin in 2006 are still the best available and consist of a multiple step process (up to 12!) , not a single office visit,  and are intended  to address this problem logically,  with an alogrithm dependent on response to empiric treatment at  each progressive step  to determine a specific diagnosis with  minimal addtional testing needed. Therefore if adherence is an issue or can't be accurately verified,  it's very unlikely the standard evaluation and treatment will be successful here.    Furthermore, response to therapy (other than acute cough suppression, which should only be used short term with avoidance of narcotic containing cough syrups if possible), can be a gradual process for which the patient may perceive immediate benefit.  Unlike going to an eye doctor where the best perscription is almost always the first one and is immediately effective, this is almost never the case in the management of chronic cough syndromes. Therefore the patient needs to commit up front to consistently adhere to recommendations  for up to 6 weeks of therapy directed at the likely underlying problem(s) before the response can be reasonably evaluated.     2) Each maintenance medication was reviewed in detail including most importantly the difference between maintenance and prns and under   what circumstances the prns are to be triggered using an action plan format that is not reflected in the computer generated alphabetically organized AVS.    Please see instructions for details which were reviewed in writing and the patient given  a copy highlighting the part that I personally wrote and discussed at today's ov.   See instructions for specific recommendations which were reviewed directly with the patient who was given a copy with highlighter outlining the key components.   

## 2015-10-01 NOTE — Assessment & Plan Note (Signed)
Explained the natural history of uri and why it's necessary in patients at risk to treat GERD aggressively - at least  short term -   to reduce risk of evolving cyclical cough initially  triggered by epithelial injury and a heightened sensitivty to the effects of any upper airway irritants,  most importantly acid - related - then perpetuated by epithelial injury related to the cough itself as the upper airway collapses on itself.  That is, the more sensitive the epithelium becomes once it is damaged by the virus, the more the ensuing irritability> the more the cough, the more the secondary reflux (especially in those prone to reflux) the more the irritation of the sensitive mucosa and so on in a  Classic cyclical pattern.    She is prone to this in the future and I would be happy to see her back if needing more than otcs to control the problem

## 2015-10-01 NOTE — Progress Notes (Signed)
Subjective:    Patient ID: Veronica Gates, female    DOB: 1975-12-20,    MRN: [id number]  HPI  40 yow quit smoking 2001 with seasonal rhinitis fall as teenager mostly with otc and no assoc cough with tendency to bad bronchitis every year but less as adult  then [place] trip early November 2016 with/in a week sore throat / cough > [facility] augmentin by Gyn > no better so got depo with a week's worth of no cough then back since so referred to pulmonary clinic 10/01/2015 by Dr Cyndia Bent p no better on prednisone/ gerd rx    10/01/2015 1st [facility] office visit/ Javontae Marlette  / on gerd rx since age 13  Chief Complaint  Patient presents with  . Pulmonary Consult    Referred by Dr. Antony Haste. Pt c/o cough x 4 months. Cough is non prod and is esp worse at night and with exertion. She has occ CP after a coughing spell. She gets SOB with minimal exertion such as taking a shower.   cough is more dry than wet/ worse at hs with sensation of tickle  Only sob with cough/ hurts diffusely over anterior chest just with coughing fits/ no vomit/ gagging  No obvious other patterns in day to day or daytime variabilty or assoc cp or chest tightness, subjective wheeze overt sinus or hb symptoms. No unusual exp hx or h/o childhood pna/ asthma or knowledge of premature birth.   Sleeping ok without nocturnal  or early am exacerbation  of respiratory  c/o's or need for noct saba. Also denies any obvious fluctuation of symptoms with weather or environmental changes or other aggravating or alleviating factors except as outlined above   Current Medications, Allergies, Complete Past Medical History, Past Surgical History, Family History, and Social History were reviewed in [employer] record.             Review of Systems  Constitutional: Negative for fever, chills and unexpected weight change.  HENT: Negative for congestion, dental problem, ear pain, nosebleeds, postnasal drip,  rhinorrhea, sinus pressure, sneezing, sore throat, trouble swallowing and voice change.   Eyes: Negative for visual disturbance.  Respiratory: Positive for cough and shortness of breath. Negative for choking.   Cardiovascular: Positive for chest pain. Negative for leg swelling.  Gastrointestinal: Negative for vomiting, abdominal pain and diarrhea.  Genitourinary: Negative for difficulty urinating.       Acid heartburn  Musculoskeletal: Negative for arthralgias.  Skin: Negative for rash.  Neurological: Negative for tremors, syncope and headaches.  Hematological: Does not bruise/bleed easily.       Objective:   Physical Exam   amb slt hoarse wf nad  Wt Readings from Last 3 Encounters:  10/01/15 172 lb (78.019 kg)  09/13/12 194 lb (87.998 kg)  04/05/12 193 lb (87.544 kg)    Vital signs reviewed   HEENT: nl dentition, turbinates, and oropharynx. Nl external ear canals without cough reflex   NECK :  without JVD/Nodes/TM/ nl carotid upstrokes bilaterally   LUNGS: no acc muscle use,  Nl contour chest which is clear to A and P bilaterally without cough on insp or exp maneuvers   CV:  RRR  no s3 or murmur or increase in P2, no edema   ABD:  soft and nontender with nl inspiratory excursion in the supine position. No bruits or organomegaly, bowel sounds nl  MS:  Nl gait/ ext warm without deformities, calf tenderness, cyanosis or clubbing  No obvious joint restrictions   SKIN: warm and dry without lesions    NEURO:  alert, approp, nl sensorium with  no motor deficits     I personally reviewed images and agree with radiology impression as follows:  CXR:  07/11/15 wnl      Assessment & Plan:

## 2015-10-01 NOTE — Patient Instructions (Addendum)
The key to effective treatment for your cough is eliminating the non-stop cycle of cough you're stuck in long enough to let your airway heal completely and then see if there is anything still making you cough once you stop the cough suppression, but this should take no more than 5 days to figure out  First take delsym two tsp every 12 hours and supplement if needed with  Tylenol #3   up to 1-2 every 4 hours to suppress the urge to cough at all or even clear your throat. Swallowing water or using ice chips/non mint and menthol containing candies (such as lifesavers or sugarless jolly ranchers) are also effective.  You should rest your voice and avoid activities that you know make you cough.  Once you have eliminated the cough for 3 straight days try reducing the tylenol #3 first,  then the delsym as tolerated.    Prednisone 10 mg take  4 each am x 2 days,   2 each am x 2 days,  1 each am x 2 days and stop (this is to eliminate allergies and inflammation from coughing)  Protonix (pantoprazole) Take 30-60 min before first meal of the day and Pepcid 20 mg one bedtime plus Chlorpheniramine 4 mg x 2 at bedtime (both available over the counter)  until cough is completely gone for at least a week without the need for cough suppression  GERD (REFLUX)  is an extremely common cause of respiratory symptoms, many times with no significant heartburn at all.    It can be treated with medication, but also with lifestyle changes including avoidance of late meals, excessive alcohol, smoking cessation, and avoid fatty foods, chocolate, peppermint, colas, red wine, and acidic juices such as orange juice.  NO MINT OR MENTHOL PRODUCTS SO NO COUGH DROPS  USE HARD CANDY INSTEAD (jolley ranchers or Stover's or Lifesavers (all available in sugarless versions) NO OIL BASED VITAMINS - use powdered substitutes.  If not all better in 2 weeks return to clinic for step #2

## 2015-10-05 ENCOUNTER — Institutional Professional Consult (permissible substitution): Payer: Self-pay | Admitting: Internal Medicine

## 2015-12-12 ENCOUNTER — Other Ambulatory Visit: Payer: Self-pay | Admitting: Internal Medicine

## 2018-06-29 ENCOUNTER — Ambulatory Visit (HOSPITAL_COMMUNITY)
Admission: EM | Admit: 2018-06-29 | Discharge: 2018-06-29 | Disposition: A | Discharge status: Home / Self Care | Payer: BLUE CROSS/BLUE SHIELD | Attending: Family Medicine | Admitting: Family Medicine

## 2018-06-29 ENCOUNTER — Encounter (HOSPITAL_COMMUNITY): Payer: Self-pay | Admitting: Emergency Medicine

## 2018-06-29 DIAGNOSIS — G43719 Chronic migraine without aura, intractable, without status migrainosus: Secondary | ICD-10-CM | POA: Diagnosis not present

## 2018-06-29 MED ORDER — KETOROLAC TROMETHAMINE 60 MG/2ML IM SOLN
60.0000 mg | Freq: Once | INTRAMUSCULAR | Status: AC
  Administered 2018-06-29: 60 mg via INTRAMUSCULAR

## 2018-06-29 MED ORDER — METOCLOPRAMIDE HCL 5 MG/ML IJ SOLN
INTRAMUSCULAR | Status: AC
  Filled 2018-06-29: qty 2

## 2018-06-29 MED ORDER — METOCLOPRAMIDE HCL 5 MG/ML IJ SOLN
5.0000 mg | Freq: Once | INTRAMUSCULAR | Status: AC
  Administered 2018-06-29: 5 mg via INTRAMUSCULAR

## 2018-06-29 MED ORDER — ALPRAZOLAM 0.5 MG PO TABS: 0.5000 mg | ORAL_TABLET | Freq: Every evening | ORAL | 0 refills | Status: DC | PRN

## 2018-06-29 MED ORDER — HYDROCODONE-ACETAMINOPHEN 5-325 MG PO TABS
ORAL_TABLET | ORAL | Status: AC
  Filled 2018-06-29: qty 1

## 2018-06-29 MED ORDER — DEXAMETHASONE SODIUM PHOSPHATE 10 MG/ML IJ SOLN
INTRAMUSCULAR | Status: AC
  Filled 2018-06-29: qty 1

## 2018-06-29 MED ORDER — DEXAMETHASONE SODIUM PHOSPHATE 10 MG/ML IJ SOLN
10.0000 mg | Freq: Once | INTRAMUSCULAR | Status: AC
  Administered 2018-06-29: 10 mg via INTRAMUSCULAR

## 2018-06-29 MED ORDER — HYDROCODONE-ACETAMINOPHEN 5-325 MG PO TABS
1.0000 | ORAL_TABLET | Freq: Once | ORAL | Status: AC
  Administered 2018-06-29: 1 via ORAL

## 2018-06-29 MED ORDER — KETOROLAC TROMETHAMINE 60 MG/2ML IM SOLN
INTRAMUSCULAR | Status: AC
  Filled 2018-06-29: qty 2

## 2018-06-29 NOTE — ED Triage Notes (Signed)
Triaged by provider  

## 2018-06-29 NOTE — ED Provider Notes (Signed)
[facility]    CSN: [id number] Arrival date & time: 06/29/18  1523     History   Chief Complaint No chief complaint on file.   HPI Veronica Gates is a 42 y.o. female.   HPI  Patient with a known intractable migraine condition She is under the care of her primary care doctor and headache clinic in [place] through [facility] She was seen recently and her medications were changed In addition to her migraine she was diagnosed with a medication rebound headache and her medicines were changed She is here today with an unremitting headache.  She is here with her mother who insists that she needs to be looked at carefully.  She has dizziness.  She has shakes.  She has anxiety.  Her appetite is decreased.  Mother thinks that she might have something worse than migraines, they want imaging.  I explained to them that we cannot do an MRI through an urgent care clinic. I did review her last few notes from neurology and from her primary care doctor.  They are looking at her carefully.  She has been worked up carefully.  She has been tried on every medication that I can think of for migraines.  She is been on nonsteroidal anti-inflammatories, Tylenol, narcotics, antidepressants, beta-blockers and blood pressure medication, triptans, preventative such as Topamax and Zonegran.  She is under the care of psychiatrist for  "anxiety, depression, and OCD" She is been disabled from working because her headaches recently.  The disability is making her feel "more worthless".  Past Medical History:  Diagnosis Date  . Anxiety   . Depression   . High cholesterol   . Hx of amenorrhea   . Hypertension   . Insomnia 2009  . Migraines   . Oligomenorrhea   . UTI (urinary tract infection) 2002  . Vitamin D deficiency 2008  . Yeast vaginitis 2002    Patient Active Problem List   Diagnosis Date Noted  . Upper airway cough syndrome 10/01/2015  . GERD without esophagitis 10/01/2015  .  Hypertension   . High cholesterol   . Anxiety   . Depression   . Migraines   . Hx of amenorrhea   . Insomnia   . Oligomenorrhea   . Vitamin D deficiency   . Yeast vaginitis   . UTI (urinary tract infection)   . Constipation 02/10/2012  . Abdominal bloating 02/10/2012  . Irregular menses 01/19/2012  . Amenorrhea 01/19/2012    Past Surgical History:  Procedure Laterality Date  . CESAREAN SECTION    . OVARIAN CYST REMOVAL     twice    OB History    Gravida  3   Para  3   Term      Preterm      AB      Living  3     SAB      TAB      Ectopic      Multiple      Live Births               Home Medications    Prior to Admission medications   Medication Sig Start Date End Date Taking? Authorizing Provider  baclofen (LIORESAL) 10 MG tablet Take 10 mg by mouth 3 (three) times daily.   Yes [provider]  esomeprazole (NEXIUM) 40 MG capsule Take 40 mg by mouth daily at 12 noon.   Yes [provider]  hydrOXYzine (ATARAX/VISTARIL) 25 MG tablet  Take 25 mg by mouth 3 (three) times daily as needed.   Yes [provider]  MAGNESIUM PO Take by mouth.   Yes [provider]  prochlorperazine (COMPAZINE) 10 MG tablet Take 10 mg by mouth every 6 (six) hours as needed for nausea or vomiting.   Yes [provider]  Riboflavin (VITAMIN B-2 PO) Take by mouth.   Yes [provider]  rizatriptan (MAXALT) 10 MG tablet Take 10 mg by mouth as needed for migraine. May repeat in 2 hours if needed   Yes [provider]  ALPRAZolam Prudy Feeler) 0.5 MG tablet Take 1 tablet (0.5 mg total) by mouth at bedtime as needed for anxiety. 06/29/18   Eustace Moore, MD  buPROPion (WELLBUTRIN XL) 300 MG 24 hr tablet Take 300 mg by mouth daily.    [provider]  desvenlafaxine (PRISTIQ) 50 MG 24 hr tablet Take 50 mg by mouth daily.    [provider]  fenofibrate (TRICOR) 145 MG tablet Take 145 mg by mouth daily.     [provider]  simvastatin (ZOCOR) 40 MG tablet Take 40 mg by mouth every evening.    [provider]  TRAZODONE HCL PO Take 1 tablet by mouth at bedtime.    [provider]  zonisamide (ZONEGRAN) 100 MG capsule 5 capsules daily. 05/01/15   [provider]    Family History Family History  Problem Relation Age of Onset  . Diabetes Sister   . Hypertension Sister   . Hyperlipidemia Sister   . Heart disease Sister   . Thrombocytopenia Sister   . Thrombocytopenia Brother   . Gout Brother   . Hypertension Brother   . Hyperlipidemia Brother   . Diabetes type II Father   . Hypertension Father   . Heart disease Father   . Thrombocytopenia Father   . Gout Father   . Throat cancer Mother        hpv  . Depression Mother   . Anxiety disorder Mother   . Anemia Mother   . Allergies Mother   . Asthma Son     Social History Social History   Tobacco Use  . Smoking status: Former Smoker    Packs/day: 0.75    Years: 10.00    Pack years: 7.50    Types: Cigarettes    Last attempt to quit: 08/16/1999    Years since quitting: 18.8  . Smokeless tobacco: Never Used  Substance Use Topics  . Alcohol use: No    Alcohol/week: 0.0 standard drinks  . Drug use: Not on file     Allergies   Floxin [ofloxacin] and Sumatriptan   Review of Systems Review of Systems  Constitutional: Positive for appetite change. Negative for chills, fever and unexpected weight change.  HENT: Negative for dental problem, ear pain and sore throat.   Eyes: Positive for visual disturbance. Negative for pain.  Respiratory: Negative for cough and shortness of breath.   Cardiovascular: Negative for chest pain and palpitations.  Gastrointestinal: Negative for abdominal pain, nausea and vomiting.  Genitourinary: Negative for dysuria and hematuria.  Musculoskeletal: Negative for arthralgias and back pain.  Skin: Negative for color change and rash.  Neurological: Positive for  dizziness and headaches. Negative for seizures and syncope.  Psychiatric/Behavioral: Positive for dysphoric mood. The patient is nervous/anxious.   All other systems reviewed and are negative.    Physical Exam Triage Vital Signs ED Triage Vitals  Enc Vitals Group     BP  06/29/18 1537 135/89     Pulse Rate 06/29/18 1537 (!) 122     Resp 06/29/18 1537 20     Temp 06/29/18 1537 98.3 F (36.8 C)     Temp Source 06/29/18 1537 Oral     SpO2 06/29/18 1537 100 %     Weight --      Height --      Head Circumference --      Peak Flow --      Pain Score 06/29/18 1549 6     Pain Loc --      Pain Edu? --      Excl. in GC? --    No data found.  Updated Vital Signs BP 135/89 (BP Location: Left Arm)   Pulse (!) 122   Temp 98.3 F (36.8 C) (Oral)   Resp 20   SpO2 100%       Physical Exam  Constitutional: She appears well-developed and well-nourished. She appears distressed.  Obviously anxious, tremulous  HENT:  Head: Normocephalic and atraumatic.  Right Ear: External ear normal.  Left Ear: External ear normal.  Nose: Nose normal.  Mouth/Throat: Oropharynx is clear and moist. No oropharyngeal exudate.  Eyes: Pupils are equal, round, and reactive to light. Conjunctivae are normal.  Neck: Normal range of motion. No thyromegaly present.  Cardiovascular: Normal rate, regular rhythm and normal heart sounds.  Pulmonary/Chest: Effort normal and breath sounds normal. No respiratory distress.  Abdominal: Soft. She exhibits no distension.  Musculoskeletal: Normal range of motion. She exhibits no edema.  Lymphadenopathy:    She has no cervical adenopathy.  Neurological: She is alert. She displays normal reflexes. No cranial nerve deficit. Coordination normal.  Brisk reflexes.  Skin: Skin is warm and dry.  Psychiatric: Her speech is normal and behavior is normal. Thought content normal. Her mood appears anxious. Cognition and memory are normal. She expresses impulsivity.     UC  Treatments / Results  Labs (all labs ordered are listed, but only abnormal results are displayed) Labs Reviewed - No data to display  EKG None  Radiology No results found.  Procedures Procedures (including critical care time)  Medications Ordered in UC Medications  ketorolac (TORADOL) injection 60 mg (60 mg Intramuscular Given 06/29/18 1618)  metoCLOPramide (REGLAN) injection 5 mg (5 mg Intramuscular Given 06/29/18 1617)  dexamethasone (DECADRON) injection 10 mg (10 mg Intramuscular Given 06/29/18 1618)  HYDROcodone-acetaminophen (NORCO/VICODIN) 5-325 MG per tablet 1 tablet (1 tablet Oral Given 06/29/18 1707)    Initial Impression / Assessment and Plan / UC Course  I have reviewed the triage vital signs and the nursing notes.  Pertinent labs & imaging results that were available during my care of the patient were reviewed by me and considered in my medical decision making (see chart for details).     I explained that she had had an extensive work-up and treatment.  I explained that there was little I could add at an urgent care center.  I offered her an injection for her headache pain which she accepted.  She was given Toradol plus Decadron plus Reglan.  30 minutes later she states her pain was no better.  I therefore gave her 1 Vicodin and sent her home to rest. Because of her obvious anxiety, I am going to give her 10 Xanax.  I explained her why this is not a good long-term medicine.  This is a short-term medicine because of all the changes she is going through.  She can take  one at bedtime.  She is advised this will not be refilled.  She needs to follow-up with her primary care doctor next week Final Clinical Impressions(s) / UC Diagnoses   Final diagnoses:  Intractable chronic migraine without aura and without status migrainosus     Discharge Instructions     Follow up with your PCP to discuss additional imaging ( MRI) Follow up with your mental health provider to review  the increased stress you are coping with due to the disability Continue the plan outlined by the headache clinic An injection is given today in hopes it will help Take the xanax at bedtime This can cause drowsiness and dizziness, so lie down promptly This is habit forming and not a good long term medicine.  Take only when needed    ED Prescriptions    Medication Sig Dispense Auth. Provider   ALPRAZolam Prudy Feeler) 0.5 MG tablet Take 1 tablet (0.5 mg total) by mouth at bedtime as needed for anxiety. 10 tablet Eustace Moore, MD     Controlled Substance Prescriptions [place] Controlled Substance Registry consulted? Not Applicable   Eustace Moore, MD 06/29/18 1744

## 2018-06-29 NOTE — Discharge Instructions (Addendum)
Follow up with your PCP to discuss additional imaging ( MRI) Follow up with your mental health provider to review the increased stress you are coping with due to the disability Continue the plan outlined by the headache clinic An injection is given today in hopes it will help Take the xanax at bedtime This can cause drowsiness and dizziness, so lie down promptly This is habit forming and not a good long term medicine.  Take only when needed

## 2018-09-25 ENCOUNTER — Other Ambulatory Visit: Payer: Self-pay

## 2018-09-25 ENCOUNTER — Encounter (HOSPITAL_COMMUNITY): Payer: Self-pay

## 2018-09-25 ENCOUNTER — Ambulatory Visit (HOSPITAL_COMMUNITY)
Admission: EM | Admit: 2018-09-25 | Discharge: 2018-09-25 | Disposition: A | Discharge status: Home / Self Care | Payer: Managed Care, Other (non HMO) | Attending: Internal Medicine | Admitting: Internal Medicine

## 2018-09-25 DIAGNOSIS — G43901 Migraine, unspecified, not intractable, with status migrainosus: Secondary | ICD-10-CM

## 2018-09-25 DIAGNOSIS — G43101 Migraine with aura, not intractable, with status migrainosus: Secondary | ICD-10-CM

## 2018-09-25 MED ORDER — KETOROLAC TROMETHAMINE 60 MG/2ML IM SOLN
60.0000 mg | Freq: Once | INTRAMUSCULAR | Status: AC
  Administered 2018-09-25: 60 mg via INTRAMUSCULAR

## 2018-09-25 MED ORDER — KETOROLAC TROMETHAMINE 60 MG/2ML IM SOLN
INTRAMUSCULAR | Status: AC
  Filled 2018-09-25: qty 2

## 2018-09-25 MED ORDER — METOCLOPRAMIDE HCL 5 MG/ML IJ SOLN
5.0000 mg | Freq: Once | INTRAMUSCULAR | Status: AC
  Administered 2018-09-25: 5 mg via INTRAMUSCULAR

## 2018-09-25 MED ORDER — METOCLOPRAMIDE HCL 5 MG/ML IJ SOLN
INTRAMUSCULAR | Status: AC
  Filled 2018-09-25: qty 2

## 2018-09-25 NOTE — ED Triage Notes (Signed)
Pt  Presents to [facility] for migraine x3 days and today pt has been experiencing a numbness and tingling sensation from foot up to wait and now has escalated to total rt side.

## 2018-09-25 NOTE — Discharge Instructions (Signed)
Go to the ED if mental status changes, if pain worsens or if weakness develops in extremities

## 2018-12-11 ENCOUNTER — Emergency Department (HOSPITAL_COMMUNITY)
Admission: EM | Admit: 2018-12-11 | Discharge: 2018-12-11 | Disposition: A | Discharge status: Home / Self Care | Payer: Managed Care, Other (non HMO) | Attending: Emergency Medicine | Admitting: Emergency Medicine

## 2018-12-11 ENCOUNTER — Encounter (HOSPITAL_COMMUNITY): Payer: Self-pay | Admitting: Emergency Medicine

## 2018-12-11 ENCOUNTER — Other Ambulatory Visit: Payer: Self-pay

## 2018-12-11 DIAGNOSIS — H53149 Visual discomfort, unspecified: Secondary | ICD-10-CM | POA: Insufficient documentation

## 2018-12-11 DIAGNOSIS — Z87891 Personal history of nicotine dependence: Secondary | ICD-10-CM | POA: Diagnosis not present

## 2018-12-11 DIAGNOSIS — Z79899 Other long term (current) drug therapy: Secondary | ICD-10-CM | POA: Insufficient documentation

## 2018-12-11 DIAGNOSIS — I10 Essential (primary) hypertension: Secondary | ICD-10-CM | POA: Diagnosis not present

## 2018-12-11 DIAGNOSIS — G43809 Other migraine, not intractable, without status migrainosus: Secondary | ICD-10-CM | POA: Diagnosis present

## 2018-12-11 MED ORDER — SODIUM CHLORIDE 0.9 % IV BOLUS
1000.0000 mL | Freq: Once | INTRAVENOUS | Status: AC
  Administered 2018-12-11: 1000 mL via INTRAVENOUS

## 2018-12-11 MED ORDER — METOCLOPRAMIDE HCL 5 MG/ML IJ SOLN
10.0000 mg | Freq: Once | INTRAMUSCULAR | Status: AC
  Administered 2018-12-11: 10 mg via INTRAVENOUS
  Filled 2018-12-11: qty 2

## 2018-12-11 MED ORDER — KETOROLAC TROMETHAMINE 30 MG/ML IJ SOLN
30.0000 mg | Freq: Once | INTRAMUSCULAR | Status: AC
  Administered 2018-12-11: 30 mg via INTRAVENOUS
  Filled 2018-12-11: qty 1

## 2018-12-11 MED ORDER — DEXAMETHASONE SODIUM PHOSPHATE 4 MG/ML IJ SOLN
10.0000 mg | Freq: Once | INTRAMUSCULAR | Status: AC
  Administered 2018-12-11: 10 mg via INTRAVENOUS
  Filled 2018-12-11: qty 3

## 2018-12-11 MED ORDER — ONDANSETRON HCL 4 MG/2ML IJ SOLN
4.0000 mg | Freq: Once | INTRAMUSCULAR | Status: AC
  Administered 2018-12-11: 4 mg via INTRAVENOUS
  Filled 2018-12-11: qty 2

## 2018-12-11 MED ORDER — DIPHENHYDRAMINE HCL 50 MG/ML IJ SOLN
25.0000 mg | Freq: Once | INTRAMUSCULAR | Status: AC
  Administered 2018-12-11: 25 mg via INTRAVENOUS
  Filled 2018-12-11: qty 1

## 2018-12-11 NOTE — ED Provider Notes (Signed)
[facility] Provider Note   CSN: [id number] Arrival date & time: 12/11/18  1627    History   Chief Complaint Chief Complaint  Patient presents with  . Migraine    HPI Farrel GordonKatherine R Kaluzny is a 43 y.o. female past medical history of anxiety, migraines, hypertension who presents for evaluation of migraine headache that is been ongoing for last 2 weeks.  Patient states that this feels like her normal headache though has started to cluster on the right side.  She reports that this will occasionally happen with her migraines.  She states that currently the headache is an 8/10 and feels like a "throbbing" type headache.   States she is taking her at home medications with no improvement.  She reports some associated blurry vision, photophobia, sensitivity to sounds which she states is normal for her migraines.  She also endorses some nausea but no vomiting.  She saw her neurologist about a week ago to get a Toradol injection but states she continues to have migraines.  Last appointment with neurology was 10/31/2018.  Patient denies any preceding trauma, injury, fall.  She has not noted any fevers, difficulty ambulating, numbness/weakness of her arms or legs.     The history is provided by the patient.    Past Medical History:  Diagnosis Date  . Anxiety   . Depression   . High cholesterol   . Hx of amenorrhea   . Hypertension   . Insomnia 2009  . Migraines   . Oligomenorrhea   . UTI (urinary tract infection) 2002  . Vitamin D deficiency 2008  . Yeast vaginitis 2002    Patient Active Problem List   Diagnosis Date Noted  . Upper airway cough syndrome 10/01/2015  . GERD without esophagitis 10/01/2015  . Hypertension   . High cholesterol   . Anxiety   . Depression   . Migraines   . Hx of amenorrhea   . Insomnia   . Oligomenorrhea   . Vitamin D deficiency   . Yeast vaginitis   . UTI (urinary tract infection)   . Constipation 02/10/2012  .  Abdominal bloating 02/10/2012  . Irregular menses 01/19/2012  . Amenorrhea 01/19/2012    Past Surgical History:  Procedure Laterality Date  . CESAREAN SECTION    . OVARIAN CYST REMOVAL     twice     OB History    Gravida  3   Para  3   Term      Preterm      AB      Living  3     SAB      TAB      Ectopic      Multiple      Live Births               Home Medications    Prior to Admission medications   Medication Sig Start Date End Date Taking? Authorizing Provider  baclofen (LIORESAL) 10 MG tablet Take 10 mg by mouth 3 (three) times daily.   Yes [provider]  buPROPion (WELLBUTRIN XL) 300 MG 24 hr tablet Take 300 mg by mouth daily.   Yes [provider]  desvenlafaxine (PRISTIQ) 100 MG 24 hr tablet Take 100 mg by mouth daily.    Yes [provider]  MAGNESIUM PO Take 500 mg by mouth daily.    Yes [provider]  prochlorperazine (COMPAZINE) 10 MG tablet Take 10 mg by mouth every  6 (six) hours as needed for nausea or vomiting.   Yes [provider]  Riboflavin (VITAMIN B-2 PO) Take 1 tablet by mouth daily.    Yes [provider]  simvastatin (ZOCOR) 40 MG tablet Take 40 mg by mouth every evening.   Yes [provider]  traZODone (DESYREL) 50 MG tablet Take 50 mg by mouth at bedtime.    Yes [provider]  zonisamide (ZONEGRAN) 100 MG capsule Take 400 mg by mouth daily.  05/01/15  Yes [provider]  ALPRAZolam Prudy Feeler) 0.5 MG tablet Take 1 tablet (0.5 mg total) by mouth at bedtime as needed for anxiety. Patient not taking: Reported on 12/11/2018 06/29/18   Eustace Moore, MD    Family History Family History  Problem Relation Age of Onset  . Diabetes Sister   . Hypertension Sister   . Hyperlipidemia Sister   . Heart disease Sister   . Thrombocytopenia Sister   . Thrombocytopenia Brother   . Gout Brother   . Hypertension Brother   . Hyperlipidemia Brother   .  Diabetes type II Father   . Hypertension Father   . Heart disease Father   . Thrombocytopenia Father   . Gout Father   . Throat cancer Mother        hpv  . Depression Mother   . Anxiety disorder Mother   . Anemia Mother   . Allergies Mother   . Asthma Son     Social History Social History   Tobacco Use  . Smoking status: Former Smoker    Packs/day: 0.75    Years: 10.00    Pack years: 7.50    Types: Cigarettes    Last attempt to quit: 08/16/1999    Years since quitting: 19.3  . Smokeless tobacco: Never Used  Substance Use Topics  . Alcohol use: No    Alcohol/week: 0.0 standard drinks  . Drug use: Not on file     Allergies   Floxin [ofloxacin] and Sumatriptan   Review of Systems Review of Systems  Constitutional: Negative for fever.  Eyes: Positive for photophobia and visual disturbance.  Cardiovascular: Negative for chest pain.  Gastrointestinal: Negative for abdominal pain, nausea and vomiting.  Neurological: Positive for headaches. Negative for weakness.  All other systems reviewed and are negative.    Physical Exam Updated Vital Signs BP (!) 130/95 (BP Location: Right Arm)   Pulse 83   Temp 98.1 F (36.7 C) (Oral)   Resp 18   LMP 12/04/2018   SpO2 99%   Physical Exam Vitals signs and nursing note reviewed.  Constitutional:      Appearance: Normal appearance. She is well-developed.  HENT:     Head: Normocephalic and atraumatic.  Eyes:     General: Lids are normal.     Conjunctiva/sclera: Conjunctivae normal.     Pupils: Pupils are equal, round, and reactive to light.     Comments: PERRL. EOMs intact without any difficulty.   Neck:     Musculoskeletal: Full passive range of motion without pain and neck supple.     Comments: Full flexion/extension and lateral movement of neck fully intact. No bony midline tenderness. No deformities or crepitus. Neck is supple and without rigidity.  Cardiovascular:     Rate and Rhythm: Normal rate and regular  rhythm.     Pulses: Normal pulses.     Heart sounds: Normal heart sounds. No murmur. No friction rub. No gallop.   Pulmonary:  Effort: Pulmonary effort is normal.     Breath sounds: Normal breath sounds.  Abdominal:     Palpations: Abdomen is soft. Abdomen is not rigid.     Tenderness: There is no abdominal tenderness. There is no guarding.  Musculoskeletal: Normal range of motion.  Skin:    General: Skin is warm and dry.     Capillary Refill: Capillary refill takes less than 2 seconds.  Neurological:     Mental Status: She is alert and oriented to person, place, and time.     Comments: Cranial nerves III-XII intact Follows commands, Moves all extremities  5/5 strength to BUE and BLE  Sensation intact throughout all major nerve distributions No gait abnormalities  No slurred speech. No facial droop.   Psychiatric:        Speech: Speech normal.      ED Treatments / Results  Labs (all labs ordered are listed, but only abnormal results are displayed) Labs Reviewed - No data to display  EKG None  Radiology No results found.  Procedures Procedures (including critical care time)  Medications Ordered in ED Medications  sodium chloride 0.9 % bolus 1,000 mL (0 mLs Intravenous Stopped 12/11/18 1928)  ondansetron (ZOFRAN) injection 4 mg (4 mg Intravenous Given 12/11/18 1731)  metoCLOPramide (REGLAN) injection 10 mg (10 mg Intravenous Given 12/11/18 1730)  diphenhydrAMINE (BENADRYL) injection 25 mg (25 mg Intravenous Given 12/11/18 1731)  ketorolac (TORADOL) 30 MG/ML injection 30 mg (30 mg Intravenous Given 12/11/18 1730)  dexamethasone (DECADRON) injection 10 mg (10 mg Intravenous Given 12/11/18 1730)     Initial Impression / Assessment and Plan / ED Course  I have reviewed the triage vital signs and the nursing notes.  Pertinent labs & imaging results that were available during my care of the patient were reviewed by me and considered in my medical decision making (see chart  for details).        43 year old female who presents for evaluation of headache x2 weeks.  History of migraines states this feels similar.  Associated with blurry vision, photophobia and nausea which she states is typical of her migraines.  No fevers. Patient is afebrile, non-toxic appearing, sitting comfortably on examination table. Vital signs reviewed and stable.  No neuro deficits on exam.  We will plan to treat with migraine cocktail and reassess.  Patient states this feels like her similar migraines.  No indication for head CT.  History/physical exam not concerning for CVA, ICH, dural venous thrombosis.  Reevaluation after migraine cocktail.  Patient is sitting up in bed resting comfortably.  She states she feels much better.  Vitals stable.  Patient states she feels ready to go home.  Instructed her to follow-up with neurologist. At this time, patient exhibits no emergent life-threatening condition that require further evaluation in ED or admission. Patient had ample opportunity for questions and discussion. All patient's questions were answered with full understanding. Strict return precautions discussed. Patient expresses understanding and agreement to plan.   Portions of this note were generated with Scientist, clinical (histocompatibility and immunogenetics). Dictation errors may occur despite best attempts at proofreading.    Final Clinical Impressions(s) / ED Diagnoses   Final diagnoses:  Other migraine without status migrainosus, not intractable    ED Discharge Orders    None       Rosana Hoes 12/11/18 2309    Sabas Sous, MD 12/16/18 1555

## 2018-12-11 NOTE — ED Triage Notes (Signed)
Pt reports intense migraine today, states she has missed 11 days of work in march, can not get rid of the migraines. States migraine feels like a cluster today.

## 2018-12-11 NOTE — ED Notes (Signed)
Patient verbalizes understanding of discharge instructions. Opportunity for questioning and answers were provided. Armband removed by staff, pt discharged from ED ambulatory.   

## 2018-12-11 NOTE — Discharge Instructions (Signed)
Follow-up with your neurologist.  Return to emergency department for any fever, worsening headache, difficulty walking, numbness/weakness of your arms or legs or any other worsening concerning symptoms.

## 2019-04-01 ENCOUNTER — Ambulatory Visit (INDEPENDENT_AMBULATORY_CARE_PROVIDER_SITE_OTHER): Payer: Self-pay | Admitting: Cardiology

## 2019-04-01 ENCOUNTER — Other Ambulatory Visit: Payer: Self-pay

## 2019-04-01 ENCOUNTER — Encounter: Payer: Self-pay | Admitting: Cardiology

## 2019-04-01 VITALS — BP 134/94 | HR 112 | Ht 69.0 in | Wt 154.0 lb

## 2019-04-01 DIAGNOSIS — Q676 Pectus excavatum: Secondary | ICD-10-CM | POA: Diagnosis not present

## 2019-04-01 DIAGNOSIS — R5381 Other malaise: Secondary | ICD-10-CM | POA: Diagnosis not present

## 2019-04-01 DIAGNOSIS — R5383 Other fatigue: Secondary | ICD-10-CM

## 2019-04-01 DIAGNOSIS — E782 Mixed hyperlipidemia: Secondary | ICD-10-CM

## 2019-04-01 DIAGNOSIS — R000 Tachycardia, unspecified: Secondary | ICD-10-CM

## 2019-04-01 NOTE — Progress Notes (Signed)
Primary Physician:  Chesley Noon, MD   Patient ID: Veronica Gates, female    DOB: 01/02/1976, 43 y.o.   MRN: [id number]  Subjective:    Chief Complaint  Patient presents with  . Tachycardia  . Fatigue  . New Patient (Initial Visit)    HPI: Veronica Gates  is a 43 y.o. female  with history of chronic migranes, hypertension not on therapy, hyperlipidemia, referred to [place] by Dr. Melford Aase for evaluation of fatigue and tachycardia.  She reports in June, she began having fatigue to point that she could not getting out of bed, but before this was not feeling well all the way back to Nov 2019 when she was taken out of work due to daily migraines. She noticed that she has had pressure on her chest and could not physically get out of bed due to this. She was found to have severe low vitamin D level, but has now improved. She continues to have symptoms, even with minimal activity such as brushing her teeth or folding clothes.  She has history of exertional chest discomfort, but has not noticed any over the last few months. She has noted that her heart rate elevates with any activity that she does.   She has been evaluated by sleep medicine, did not feel related to sleep. She has previously had hypertension; however, with weight loss that this improved.  Older sister had MI and CABG in her 22's. Father also had history of heart disease. She is a former smoker, but quit 20 years ago. No alcohol or drug use.  Past Medical History:  Diagnosis Date  . Anxiety   . Depression   . High cholesterol   . Hx of amenorrhea   . Hypertension   . Insomnia 2009  . Migraines   . Oligomenorrhea   . UTI (urinary tract infection) 2002  . Vitamin D deficiency 2008  . Yeast vaginitis 2002    Past Surgical History:  Procedure Laterality Date  . CESAREAN SECTION    . OVARIAN CYST REMOVAL     twice    Social History   Socioeconomic History  . Marital status: Married    Spouse name: Not on  file  . Number of children: 3  . Years of education: `  . Highest education level: Not on file  Occupational History  . Not on file  Social Needs  . Financial resource strain: Not on file  . Food insecurity    Worry: Not on file    Inability: Not on file  . Transportation needs    Medical: Not on file    Non-medical: Not on file  Tobacco Use  . Smoking status: Former Smoker    Packs/day: 0.75    Years: 10.00    Pack years: 7.50    Types: Cigarettes    Quit date: 08/16/1999    Years since quitting: 19.6  . Smokeless tobacco: Never Used  Substance and Sexual Activity  . Alcohol use: No    Alcohol/week: 0.0 standard drinks  . Drug use: Not on file  . Sexual activity: Yes    Birth control/protection: None  Lifestyle  . Physical activity    Days per week: Not on file    Minutes per session: Not on file  . Stress: Not on file  Relationships  . Social Herbalist on phone: Not on file    Gets together: Not on file    Attends religious service:  Not on file    Active member of club or organization: Not on file    Attends meetings of clubs or organizations: Not on file    Relationship status: Not on file  . Intimate partner violence    Fear of current or ex partner: Not on file    Emotionally abused: Not on file    Physically abused: Not on file    Forced sexual activity: Not on file  Other Topics Concern  . Not on file  Social History Narrative  . Not on file    Review of Systems  Constitution: Negative for decreased appetite, malaise/fatigue, weight gain and weight loss.  Eyes: Negative for visual disturbance.  Cardiovascular: Positive for dyspnea on exertion and palpitations. Negative for chest pain, claudication, leg swelling, orthopnea and syncope.  Respiratory: Negative for hemoptysis and wheezing.   Endocrine: Negative for cold intolerance and heat intolerance.  Hematologic/Lymphatic: Does not bruise/bleed easily.  Skin: Negative for nail changes.   Musculoskeletal: Negative for muscle weakness and myalgias.  Gastrointestinal: Negative for abdominal pain, change in bowel habit, nausea and vomiting.  Neurological: Positive for headaches (chronic migraines ). Negative for difficulty with concentration, dizziness and focal weakness.  Psychiatric/Behavioral: Negative for altered mental status and suicidal ideas.  All other systems reviewed and are negative.     Objective:  Blood pressure (!) 134/94, pulse (!) 112, height '5\' 9"'$  (1.753 m), weight 154 lb (69.9 kg), SpO2 98 %. Body mass index is 22.74 kg/m.    Physical Exam  Constitutional: She is oriented to person, place, and time. Vital signs are normal. She appears well-developed and well-nourished.  HENT:  Head: Normocephalic and atraumatic.  Neck: Normal range of motion.  Cardiovascular: Normal rate, regular rhythm, normal heart sounds and intact distal pulses.  Pulmonary/Chest: Effort normal and breath sounds normal. No accessory muscle usage. No respiratory distress.  Pectus excavatum   Abdominal: Soft. Bowel sounds are normal.  Musculoskeletal: Normal range of motion.  Neurological: She is alert and oriented to person, place, and time.  Skin: Skin is warm and dry.  Vitals reviewed.  Radiology: No results found.  Laboratory examination:   01/15/2019: Creatinine 1.23, EGFR 53, potassium 4.0, CMP otherwise normal.  CBC normal.  07/23/2015:Cholesterol 153, triglycerides 160, HDL 42, LDL 79.  CMP 12/05/2007  Glucose 150(H)  BUN 12  Creatinine 1.31(H)  Sodium 139  Potassium 3.2(L)  Chloride 104  CO2 25  Calcium 9.7  Total Protein 6.3  Total Bilirubin 0.4  Alkaline Phos 33(L)  AST 23  ALT 27   CBC 12/25/2007 12/24/2007 12/05/2007  WBC 10.3 8.1 7.4  Hemoglobin 12.1 12.5 13.1  Hematocrit 35.0(L) 36.5 37.4  Platelets 280 251 253   Lipid Panel  No results found for: CHOL, TRIG, HDL, CHOLHDL, VLDL, LDLCALC, LDLDIRECT HEMOGLOBIN A1C No results found for: HGBA1C, MPG TSH  No results for input(s): TSH in the last 8760 hours.  PRN Meds:. There are no discontinued medications. Current Meds  Medication Sig  . baclofen (LIORESAL) 10 MG tablet Take 10 mg by mouth 3 (three) times daily.  Marland Kitchen buPROPion (WELLBUTRIN XL) 300 MG 24 hr tablet Take 300 mg by mouth daily.  Marland Kitchen desvenlafaxine (PRISTIQ) 100 MG 24 hr tablet Take 100 mg by mouth daily.   Marland Kitchen docusate sodium (COLACE) 100 MG capsule Take 100 mg by mouth 2 (two) times daily.  Marland Kitchen MAGNESIUM PO Take 500 mg by mouth daily.   . Melatonin 300 MCG TABS Take by mouth.  . memantine (  NAMENDA) 10 MG tablet Take 10 mg by mouth 2 (two) times daily.  . prochlorperazine (COMPAZINE) 10 MG tablet Take 10 mg by mouth every 6 (six) hours as needed for nausea or vomiting.  . Riboflavin (VITAMIN B-2 PO) Take 1 tablet by mouth daily.   . rizatriptan (MAXALT) 10 MG tablet Take 10 mg by mouth as needed for migraine. May repeat in 2 hours if needed  . simvastatin (ZOCOR) 40 MG tablet Take 40 mg by mouth every evening.  . traZODone (DESYREL) 50 MG tablet Take 50 mg by mouth at bedtime.   Marland Kitchen zonisamide (ZONEGRAN) 100 MG capsule Take 400 mg by mouth daily.     Cardiac Studies:     Assessment:     ICD-10-CM   1. Sinus tachycardia  R00.0 EKG 12-Lead    [facility] CARDIAC STRESS TEST    [facility]V ECHOCARDIOGRAM COMPLETE  2. Pectus excavatum  Q67.6   3. Malaise and fatigue  R53.81 PCV ECHOCARDIOGRAM COMPLETE   R53.83   4. Mixed hyperlipidemia  E78.2    EKG 04/01/2019: Sinus tachycardia at 101 bpm, normal axis, no evidence of ischemia.   Recommendations:   Patient presents for evaluation of tachycardia and fatigue.  There are many differential diagnosis that could be contributing to her symptoms and it is difficult to differentiate. Except for pectus excavatum, physical exam is unremarkable.  EKG is without any acute abnormalities, has mild tachycardia at rest.  She does have risk factors for CAD, and feel that this should be excluded.  We will  schedule for treadmill stress testing for further evaluation and also to evaluate her heart rate response to exercise.  We will also obtain echocardiogram to exclude any structural abnormalities.  If cardiac evaluation is unyielding, she may need evaluation from rheumatology.  I have asked her to hold her simvastatin for now as this could potentially be contributing to her fatigue.  We will follow-up on her symptoms.  Blood pressure is stable.  I will see her back after the test for further evaluation and recommendations.   *I have discussed this case with Dr. Einar Gip and he personally examined the patient and participated in formulating the plan.*    Miquel Dunn, MSN, APRN, FNP-C [facility]. [place] Office: [phone] Fax: [phone]

## 2019-04-16 ENCOUNTER — Ambulatory Visit (INDEPENDENT_AMBULATORY_CARE_PROVIDER_SITE_OTHER): Payer: Self-pay

## 2019-04-16 ENCOUNTER — Other Ambulatory Visit: Payer: Self-pay

## 2019-04-16 DIAGNOSIS — R5383 Other fatigue: Secondary | ICD-10-CM | POA: Diagnosis not present

## 2019-04-16 DIAGNOSIS — R000 Tachycardia, unspecified: Secondary | ICD-10-CM | POA: Diagnosis not present

## 2019-04-16 DIAGNOSIS — R5381 Other malaise: Secondary | ICD-10-CM

## 2019-04-29 ENCOUNTER — Ambulatory Visit: Payer: Self-pay

## 2019-04-29 ENCOUNTER — Ambulatory Visit (INDEPENDENT_AMBULATORY_CARE_PROVIDER_SITE_OTHER): Payer: Self-pay | Admitting: Cardiology

## 2019-04-29 ENCOUNTER — Other Ambulatory Visit: Payer: Self-pay

## 2019-04-29 DIAGNOSIS — R000 Tachycardia, unspecified: Secondary | ICD-10-CM | POA: Diagnosis not present

## 2019-04-29 MED ORDER — ACEBUTOLOL HCL 200 MG PO CAPS: 200.0000 mg | ORAL_CAPSULE | Freq: Two times a day (BID) | ORAL | 1 refills | Status: DC

## 2019-04-29 NOTE — Progress Notes (Signed)
Patient seen today for acute nurse visit after she was unable to complete treadmill stress testing due to elevated heart rate.  Heart rate reportedly in the 150s prior to even starting her treadmill stress test.  I suspect that she may have inappropriate sinus tachycardia.  We will start acebutolol 200 mg twice a day.  We will follow-up as scheduled and depending upon how she is feeling in her heart rate response, will decide if she needs nuclear stress test done at that point.  Stress test was cancelled and resting EKG is signed under PCV cardiac stress test.

## 2019-05-07 ENCOUNTER — Encounter: Payer: Self-pay | Admitting: Cardiology

## 2019-05-07 ENCOUNTER — Ambulatory Visit: Payer: Managed Care, Other (non HMO) | Admitting: Cardiology

## 2019-05-07 ENCOUNTER — Other Ambulatory Visit: Payer: Self-pay

## 2019-05-07 VITALS — BP 107/77 | HR 79 | Ht 69.0 in | Wt 151.3 lb

## 2019-05-07 DIAGNOSIS — G43009 Migraine without aura, not intractable, without status migrainosus: Secondary | ICD-10-CM | POA: Diagnosis not present

## 2019-05-07 DIAGNOSIS — R000 Tachycardia, unspecified: Secondary | ICD-10-CM | POA: Diagnosis not present

## 2019-05-07 DIAGNOSIS — I351 Nonrheumatic aortic (valve) insufficiency: Secondary | ICD-10-CM | POA: Diagnosis not present

## 2019-05-07 DIAGNOSIS — I4711 Inappropriate sinus tachycardia, so stated: Secondary | ICD-10-CM

## 2019-05-07 DIAGNOSIS — R5381 Other malaise: Secondary | ICD-10-CM | POA: Diagnosis not present

## 2019-05-07 DIAGNOSIS — R5383 Other fatigue: Secondary | ICD-10-CM

## 2019-05-07 NOTE — Progress Notes (Signed)
Primary Physician:  Chesley Noon, MD   Patient ID: Veronica Gates, female    DOB: [birth year], 43 y.o.   MRN: [id number]  Subjective:    Chief Complaint  Patient presents with  . Tachycardia    pt having side affect from Acebutelol    HPI: Veronica Gates  is a 43 y.o. female  with history of chronic migranes, hypertension not on therapy, hyperlipidemia, recently evaluated by [place] for tachycardia and fatigue.  She reports in June, she began having fatigue to point that she could not getting out of bed, but before this was not feeling well all the way back to Nov 2019 when she was taken out of work due to daily migraines. She noticed that she has had pressure on her chest and could not physically get out of bed due to this. She was found to have severe low vitamin D level, but has now improved. She continues to have symptoms, even with minimal activity such as brushing her teeth or folding clothes. Heart rate was elevated with minimal activity. She was scheduled for GXT in our office; however, heart rate was to elevated to perform. She was started on Acebutolol as her symptoms were felt to be related to inappropriate sinus tachycardia. She underwent echocardiogram and now presents for follow up.  She has noticed significant improvement in her heart rate with being on Acebutolol and has not had heart racing symptoms.  She does report having to take more frequent naps during the day, nausea after taking the medication, and occasional lightheadedness.  No syncope.  She has been evaluated by sleep medicine, did not feel related to sleep. She has previously had hypertension; however, with weight loss that this improved.  Older sister had MI and CABG in her 29's. Father also had history of heart disease. She is a former smoker, but quit 20 years ago. No alcohol or drug use.  Past Medical History:  Diagnosis Date  . Anxiety   . Depression   . High cholesterol   . Hx of amenorrhea   .  Hypertension   . Insomnia 2009  . Migraines   . Oligomenorrhea   . UTI (urinary tract infection) 2002  . Vitamin D deficiency 2008  . Yeast vaginitis 2002    Past Surgical History:  Procedure Laterality Date  . CESAREAN SECTION    . OVARIAN CYST REMOVAL     twice    Social History   Socioeconomic History  . Marital status: Married    Spouse name: Not on file  . Number of children: 3  . Years of education: `  . Highest education level: Not on file  Occupational History  . Not on file  Social Needs  . Financial resource strain: Not on file  . Food insecurity    Worry: Not on file    Inability: Not on file  . Transportation needs    Medical: Not on file    Non-medical: Not on file  Tobacco Use  . Smoking status: Former Smoker    Packs/day: 0.75    Years: 10.00    Pack years: 7.50    Types: Cigarettes    Quit date: 08/16/1999    Years since quitting: 19.7  . Smokeless tobacco: Never Used  Substance and Sexual Activity  . Alcohol use: No    Alcohol/week: 0.0 standard drinks  . Drug use: Not on file  . Sexual activity: Yes    Birth control/protection: None  Lifestyle  .  Physical activity    Days per week: Not on file    Minutes per session: Not on file  . Stress: Not on file  Relationships  . Social Herbalist on phone: Not on file    Gets together: Not on file    Attends religious service: Not on file    Active member of club or organization: Not on file    Attends meetings of clubs or organizations: Not on file    Relationship status: Not on file  . Intimate partner violence    Fear of current or ex partner: Not on file    Emotionally abused: Not on file    Physically abused: Not on file    Forced sexual activity: Not on file  Other Topics Concern  . Not on file  Social History Narrative  . Not on file    Review of Systems  Constitution: Positive for malaise/fatigue. Negative for decreased appetite, weight gain and weight loss.  Eyes:  Negative for visual disturbance.  Cardiovascular: Negative for chest pain, claudication, dyspnea on exertion, leg swelling, orthopnea, palpitations and syncope.  Respiratory: Negative for hemoptysis and wheezing.   Endocrine: Negative for cold intolerance and heat intolerance.  Hematologic/Lymphatic: Does not bruise/bleed easily.  Skin: Negative for nail changes.  Musculoskeletal: Negative for muscle weakness and myalgias.  Gastrointestinal: Negative for abdominal pain, change in bowel habit, nausea and vomiting.  Neurological: Positive for headaches (chronic migraines ). Negative for difficulty with concentration, dizziness and focal weakness.  Psychiatric/Behavioral: Negative for altered mental status and suicidal ideas.  All other systems reviewed and are negative.     Objective:  Blood pressure 107/77, pulse 79, height '5\' 9"'  (1.753 m), weight 151 lb 4.8 oz (68.6 kg), SpO2 98 %. Body mass index is 22.34 kg/m.    Physical Exam  Constitutional: She is oriented to person, place, and time. Vital signs are normal. She appears well-developed and well-nourished.  HENT:  Head: Normocephalic and atraumatic.  Neck: Normal range of motion.  Cardiovascular: Normal rate, regular rhythm, normal heart sounds and intact distal pulses.  Pulmonary/Chest: Effort normal and breath sounds normal. No accessory muscle usage. No respiratory distress.  Pectus excavatum   Abdominal: Soft. Bowel sounds are normal.  Musculoskeletal: Normal range of motion.  Neurological: She is alert and oriented to person, place, and time.  Skin: Skin is warm and dry.  Vitals reviewed.  Radiology: No results found.  Laboratory examination:   01/15/2019: Creatinine 1.23, EGFR 53, potassium 4.0, CMP otherwise normal.  CBC normal.  07/23/2015:Cholesterol 153, triglycerides 160, HDL 42, LDL 79.  CMP 12/05/2007  Glucose 150(H)  BUN 12  Creatinine 1.31(H)  Sodium 139  Potassium 3.2(L)  Chloride 104  CO2 25  Calcium 9.7   Total Protein 6.3  Total Bilirubin 0.4  Alkaline Phos 33(L)  AST 23  ALT 27   CBC 12/25/2007 12/24/2007 12/05/2007  WBC 10.3 8.1 7.4  Hemoglobin 12.1 12.5 13.1  Hematocrit 35.0(L) 36.5 37.4  Platelets 280 251 253   Lipid Panel  No results found for: CHOL, TRIG, HDL, CHOLHDL, VLDL, LDLCALC, LDLDIRECT HEMOGLOBIN A1C No results found for: HGBA1C, MPG TSH No results for input(s): TSH in the last 8760 hours.  PRN Meds:. There are no discontinued medications. Current Meds  Medication Sig  . acebutolol (SECTRAL) 200 MG capsule Take 1 capsule (200 mg total) by mouth 2 (two) times daily.  Marland Kitchen buPROPion (WELLBUTRIN XL) 300 MG 24 hr tablet Take 300 mg by mouth  daily.  . desvenlafaxine (PRISTIQ) 100 MG 24 hr tablet Take 100 mg by mouth daily.   Marland Kitchen docusate sodium (COLACE) 100 MG capsule Take 100 mg by mouth 2 (two) times daily.  Marland Kitchen Ketotifen Fumarate (ITCHY EYE DROPS OP) Apply to eye.  Marland Kitchen MAGNESIUM PO Take 500 mg by mouth daily.   . Melatonin 300 MCG TABS Take by mouth.  . memantine (NAMENDA) 10 MG tablet Take 10 mg by mouth 2 (two) times daily.  . Riboflavin (VITAMIN B-2 PO) Take 1 tablet by mouth daily.   . rizatriptan (MAXALT) 10 MG tablet Take 10 mg by mouth as needed for migraine. May repeat in 2 hours if needed  . traZODone (DESYREL) 50 MG tablet Take 50 mg by mouth at bedtime.   . vitamin B-12 (CYANOCOBALAMIN) 500 MCG tablet Take 500 mcg by mouth daily.  Marland Kitchen zonisamide (ZONEGRAN) 100 MG capsule Take 400 mg by mouth daily.     Cardiac Studies:   Echocardiogram 04/16/2019: Left ventricle cavity is normal in size. Mild concentric hypertrophy of the left ventricle. Normal LV systolic function with EF 55%. Normal global wall motion. Doppler evidence of grade I (impaired) diastolic dysfunction, normal LAP.  Trileaflet aortic valve. Moderate (Grade III) aortic regurgitation. Mechanism of regurgitation is unclear, bases on limited visualization of the aortic valve. No other significant valvular  abnormality. Normal right atria pressure.   Assessment:     ICD-10-CM   1. Inappropriate sinus tachycardia  R00.0   2. Malaise and fatigue  R53.81    R53.83   3. Migraine without aura and without status migrainosus, not intractable  G43.009   4. Moderate aortic regurgitation  I35.1    EKG 04/01/2019: Sinus tachycardia at 101 bpm, normal axis, no evidence of ischemia.   Recommendations:   Patient is here for follow-up to discuss recent echocardiogram.  She does have moderate aortic regurgitation by echo, normal LVEF.  Do not feel that this is contributing to her symptoms.  She will need continued surveillance of aortic regurgitation.  She has had significant improvement in her heart rate since being on Acebutolol, but is having some increased fatigue, nausea, and lightheadedness after taking her medication.  I will decrease her dose of Acebutolol to 1 tablet a day to see if this will help with her symptoms.  If she continues to have symptoms or her heart rate is elevated with this, we will consider changing to propranolol given her history of migraines.  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  Patient is tearful on our discussion today, stating she feels terrible and is tired of feeling this way.  She will have to make a decision soon if she can go back to work.  I have advised her that from my standpoint she could certainly try going back to work, but if she would like for a few more weeks while we titrate her medicines she can certainly continue to be off work.  She will discuss with her job as for asked if she will be able to return in the next few weeks or not.  I will see her back in 4 weeks, but encouraged her to contact me sooner regarding her medication if needed.    Miquel Dunn, MSN, APRN, FNP-C [facility]. [place] Office: [phone] Fax: [phone]

## 2019-05-21 ENCOUNTER — Other Ambulatory Visit: Payer: Self-pay | Admitting: Cardiology

## 2019-05-21 NOTE — Telephone Encounter (Signed)
From pt

## 2019-05-22 NOTE — Telephone Encounter (Signed)
From pt

## 2019-05-23 NOTE — ED Provider Notes (Addendum)
[facility]    CSN: [id number] Arrival date & time: 09/25/18  1931      History   Chief Complaint Chief Complaint  Patient presents with  . Migraine  . Numbness    HPI Veronica Gates is a 43 y.o. female.   43 yo female w/ PMH of migraines presents to urgent care complaining of R side parasthesias.  Started with numbness and tingling in foot.  Has now progress all the way to her waist. Admits to HA.      Past Medical History:  Diagnosis Date  . Anxiety   . Depression   . High cholesterol   . Hx of amenorrhea   . Hypertension   . Insomnia 2009  . Migraines   . Oligomenorrhea   . UTI (urinary tract infection) 2002  . Vitamin D deficiency 2008  . Yeast vaginitis 2002    Patient Active Problem List   Diagnosis Date Noted  . Upper airway cough syndrome 10/01/2015  . GERD without esophagitis 10/01/2015  . Hypertension   . High cholesterol   . Anxiety   . Depression   . Migraines   . Hx of amenorrhea   . Insomnia   . Oligomenorrhea   . Vitamin D deficiency   . Yeast vaginitis   . UTI (urinary tract infection)   . Constipation 02/10/2012  . Abdominal bloating 02/10/2012  . Irregular menses 01/19/2012  . Amenorrhea 01/19/2012    Past Surgical History:  Procedure Laterality Date  . CESAREAN SECTION    . OVARIAN CYST REMOVAL     twice    OB History    Gravida  3   Para  3   Term      Preterm      AB      Living  3     SAB      TAB      Ectopic      Multiple      Live Births               Home Medications    Prior to Admission medications   Medication Sig Start Date End Date Taking? Authorizing Provider  baclofen (LIORESAL) 10 MG tablet Take 10 mg by mouth 3 (three) times daily.   Yes [provider]  buPROPion (WELLBUTRIN XL) 300 MG 24 hr tablet Take 300 mg by mouth daily.   Yes [provider]  desvenlafaxine (PRISTIQ) 100 MG 24 hr tablet Take 100 mg by mouth daily.    Yes [provider]  MAGNESIUM PO Take 500 mg by mouth daily.    Yes [provider]  prochlorperazine (COMPAZINE) 10 MG tablet Take 10 mg by mouth every 6 (six) hours as needed for nausea or vomiting.   Yes [provider]  Riboflavin (VITAMIN B-2 PO) Take 1 tablet by mouth daily.    Yes [provider]  traZODone (DESYREL) 50 MG tablet Take 50 mg by mouth at bedtime.    Yes [provider]  zonisamide (ZONEGRAN) 100 MG capsule Take 400 mg by mouth daily.  05/01/15  Yes [provider]  acebutolol (SECTRAL) 200 MG capsule TAKE 1 CAPSULE BY MOUTH TWICE A DAY 05/21/19   Toniann Fail, NP  ALPRAZolam Prudy Feeler) 0.5 MG tablet Take 1 tablet (0.5 mg total) by mouth at bedtime as needed for anxiety. Patient not taking: Reported on 12/11/2018 06/29/18   Eustace Moore, MD  docusate sodium (COLACE) 100  MG capsule Take 100 mg by mouth 2 (two) times daily.    [provider]  Ketotifen Fumarate (ITCHY EYE DROPS OP) Apply to eye.    [provider]  Melatonin 300 MCG TABS Take by mouth.    [provider]  memantine (NAMENDA) 10 MG tablet Take 10 mg by mouth 2 (two) times daily.    [provider]  rizatriptan (MAXALT) 10 MG tablet Take 10 mg by mouth as needed for migraine. May repeat in 2 hours if needed    [provider]  vitamin B-12 (CYANOCOBALAMIN) 500 MCG tablet Take 500 mcg by mouth daily.    [provider]    Family History Family History  Problem Relation Age of Onset  . Diabetes Sister   . Hypertension Sister   . Hyperlipidemia Sister   . Heart disease Sister   . Thrombocytopenia Sister   . Thrombocytopenia Brother   . Gout Brother   . Hypertension Brother   . Hyperlipidemia Brother   . Diabetes type II Father   . Hypertension Father   . Heart disease Father   . Thrombocytopenia Father   . Gout Father   . Throat cancer Mother        hpv  . Depression Mother   . Anxiety disorder  Mother   . Anemia Mother   . Allergies Mother   . Asthma Son     Social History Social History   Tobacco Use  . Smoking status: Former Smoker    Packs/day: 0.75    Years: 10.00    Pack years: 7.50    Types: Cigarettes    Quit date: 08/16/1999    Years since quitting: 19.7  . Smokeless tobacco: Never Used  Substance Use Topics  . Alcohol use: No    Alcohol/week: 0.0 standard drinks  . Drug use: Not on file     Allergies   Floxin [ofloxacin] and Sumatriptan   Review of Systems Review of Systems  Constitutional: Negative for chills and fever.  HENT: Negative for sore throat and tinnitus.   Eyes: Negative for redness.  Respiratory: Negative for cough and shortness of breath.   Cardiovascular: Negative for chest pain and palpitations.  Gastrointestinal: Negative for abdominal pain, diarrhea, nausea and vomiting.  Genitourinary: Negative for dysuria, frequency and urgency.  Musculoskeletal: Negative for myalgias.  Skin: Negative for rash.       No lesions  Neurological: Positive for numbness and headaches. Negative for facial asymmetry, speech difficulty and weakness.  Hematological: Does not bruise/bleed easily.  Psychiatric/Behavioral: Negative for behavioral problems and suicidal ideas.     Physical Exam Triage Vital Signs ED Triage Vitals  Enc Vitals Group     BP 09/25/18 2019 (!) 138/103     Pulse Rate 09/25/18 2019 (!) 103     Resp 09/25/18 2019 17     Temp 09/25/18 2019 98.4 F (36.9 C)     Temp Source 09/25/18 2019 Oral     SpO2 09/25/18 2019 98 %     Weight --      Height --      Head Circumference --      Peak Flow --      Pain Score 09/25/18 2021 10     Pain Loc --      Pain Edu? --      Excl. in [place]? --    No data found.  Updated Vital Signs BP (!) 138/103 (BP Location: Left Arm)  Pulse (!) 103   Temp 98.4 F (36.9 C) (Oral)   Resp 17   SpO2 98%   Visual Acuity Right Eye Distance:   Left Eye Distance:   Bilateral Distance:    Right  Eye Near:   Left Eye Near:    Bilateral Near:     Physical Exam Vitals signs and nursing note reviewed.  Constitutional:      General: She is not in acute distress.    Appearance: She is well-developed.  HENT:     Head: Normocephalic and atraumatic.  Eyes:     General: No scleral icterus.    Conjunctiva/sclera: Conjunctivae normal.     Pupils: Pupils are equal, round, and reactive to light.  Neck:     Musculoskeletal: Normal range of motion and neck supple.     Thyroid: No thyromegaly.     Vascular: No JVD.     Trachea: No tracheal deviation.  Cardiovascular:     Rate and Rhythm: Normal rate and regular rhythm.     Heart sounds: Normal heart sounds. No murmur. No friction rub. No gallop.   Pulmonary:     Effort: Pulmonary effort is normal.     Breath sounds: Normal breath sounds.  Abdominal:     General: Bowel sounds are normal. There is no distension.     Palpations: Abdomen is soft.     Tenderness: There is no abdominal tenderness.  Musculoskeletal: Normal range of motion.  Lymphadenopathy:     Cervical: No cervical adenopathy.  Skin:    General: Skin is warm and dry.  Neurological:     General: No focal deficit present.     Mental Status: She is alert and oriented to person, place, and time.     Cranial Nerves: No cranial nerve deficit.     Motor: No weakness.     Deep Tendon Reflexes: Reflexes normal.  Psychiatric:        Behavior: Behavior normal.        Thought Content: Thought content normal.        Judgment: Judgment normal.      UC Treatments / Results  Labs (all labs ordered are listed, but only abnormal results are displayed) Labs Reviewed - No data to display  EKG   Radiology No results found.  Procedures Procedures (including critical care time)  Medications Ordered in UC Medications  ketorolac (TORADOL) injection 60 mg (60 mg Intramuscular Given 09/25/18 2046)  metoCLOPramide (REGLAN) injection 5 mg (5 mg Intramuscular Given 09/25/18 2046)   ketorolac (TORADOL) 60 MG/2ML injection (has no administration in time range)  metoCLOPramide (REGLAN) 5 MG/ML injection (has no administration in time range)    Initial Impression / Assessment and Plan / UC Course  I have reviewed the triage vital signs and the nursing notes.  Pertinent labs & imaging results that were available during my care of the patient were reviewed by me and considered in my medical decision making (see chart for details).     No red flag neuro symptoms. 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  Final Clinical Impressions(s) / UC Diagnoses   Final diagnoses:  Complicated migraine with status migrainosus     Discharge Instructions     Go to the ED if mental status changes, if pain worsens or if weakness develops in extremities   ED Prescriptions    None     PDMP not reviewed this encounter.   Arnaldo Nataliamond, Velmer Woelfel S, MD 05/23/19 [id number]    Arnaldo Nataliamond, Fadi Menter S, MD  05/23/19 0252  

## 2019-05-23 NOTE — Telephone Encounter (Signed)
From pt

## 2019-06-04 ENCOUNTER — Ambulatory Visit (INDEPENDENT_AMBULATORY_CARE_PROVIDER_SITE_OTHER): Payer: PRIVATE HEALTH INSURANCE | Admitting: Cardiology

## 2019-06-04 ENCOUNTER — Encounter: Payer: Self-pay | Admitting: Cardiology

## 2019-06-04 ENCOUNTER — Other Ambulatory Visit: Payer: Self-pay

## 2019-06-04 VITALS — BP 109/81 | HR 73 | Temp 96.7°F | Ht 69.0 in | Wt 150.6 lb

## 2019-06-04 DIAGNOSIS — R5381 Other malaise: Secondary | ICD-10-CM

## 2019-06-04 DIAGNOSIS — G43009 Migraine without aura, not intractable, without status migrainosus: Secondary | ICD-10-CM | POA: Diagnosis not present

## 2019-06-04 DIAGNOSIS — Z8249 Family history of ischemic heart disease and other diseases of the circulatory system: Secondary | ICD-10-CM

## 2019-06-04 DIAGNOSIS — I4711 Inappropriate sinus tachycardia, so stated: Secondary | ICD-10-CM

## 2019-06-04 DIAGNOSIS — I351 Nonrheumatic aortic (valve) insufficiency: Secondary | ICD-10-CM

## 2019-06-04 DIAGNOSIS — R000 Tachycardia, unspecified: Secondary | ICD-10-CM | POA: Diagnosis not present

## 2019-06-04 DIAGNOSIS — R0609 Other forms of dyspnea: Secondary | ICD-10-CM

## 2019-06-04 DIAGNOSIS — R0600 Dyspnea, unspecified: Secondary | ICD-10-CM

## 2019-06-04 DIAGNOSIS — E559 Vitamin D deficiency, unspecified: Secondary | ICD-10-CM

## 2019-06-04 DIAGNOSIS — R5383 Other fatigue: Secondary | ICD-10-CM

## 2019-06-04 NOTE — Progress Notes (Signed)
Primary Physician:  Chesley Noon, MD   Patient ID: Veronica Gates, female    DOB: [birth year], 43 y.o.   MRN: [id number]  Subjective:    Chief Complaint  Patient presents with  . Tachycardia  . Follow-up    4 weeks    HPI: Veronica Gates  is a 43 y.o. female  with history of chronic migranes, hypertension not on therapy, hyperlipidemia, recently evaluated by [place] for tachycardia and fatigue.  She reports in June, she began having fatigue to point that she could not getting out of bed, but before this was not feeling well all the way back to Nov 2019 when she was taken out of work due to daily migraines. She noticed that she has had pressure on her chest and could not physically get out of bed due to this. She was found to have severe low vitamin D level, but has now improved. She continues to have symptoms, even with minimal activity such as brushing her teeth or folding clothes. Heart rate was elevated with minimal activity. Symptoms felt to be related to inappropriate sinus tachycardia and she was started on acebutolol.  Heart rate has improved with being on medication.  During the interim her dose was reduced to once a day due to chest burning sensation that has improved.  She continues to have a sensation of heart racing with doing activities, but states that her heart rate does not elevate.  She continues to have fatigue and is trying to push herself to do more. Also still has dyspnea on exertion.  She has been evaluated by sleep medicine, did not feel related to sleep. She has previously had hypertension; however, with weight loss that this improved.  Older sister had MI and CABG in her 31's. Father also had history of heart disease. She is a former smoker, but quit 20 years ago. No alcohol or drug use.  Past Medical History:  Diagnosis Date  . Anxiety   . Depression   . High cholesterol   . Hx of amenorrhea   . Hypertension   . Insomnia 2009  . Migraines   .  Oligomenorrhea   . UTI (urinary tract infection) 2002  . Vitamin D deficiency 2008  . Yeast vaginitis 2002    Past Surgical History:  Procedure Laterality Date  . CESAREAN SECTION    . OVARIAN CYST REMOVAL     twice    Social History   Socioeconomic History  . Marital status: Married    Spouse name: Not on file  . Number of children: 3  . Years of education: `  . Highest education level: Not on file  Occupational History  . Not on file  Social Needs  . Financial resource strain: Not on file  . Food insecurity    Worry: Not on file    Inability: Not on file  . Transportation needs    Medical: Not on file    Non-medical: Not on file  Tobacco Use  . Smoking status: Former Smoker    Packs/day: 0.75    Years: 10.00    Pack years: 7.50    Types: Cigarettes    Quit date: 08/16/1999    Years since quitting: 19.8  . Smokeless tobacco: Never Used  Substance and Sexual Activity  . Alcohol use: No    Alcohol/week: 0.0 standard drinks  . Drug use: Not on file  . Sexual activity: Yes    Birth control/protection: None  Lifestyle  .  Physical activity    Days per week: Not on file    Minutes per session: Not on file  . Stress: Not on file  Relationships  . Social Herbalist on phone: Not on file    Gets together: Not on file    Attends religious service: Not on file    Active member of club or organization: Not on file    Attends meetings of clubs or organizations: Not on file    Relationship status: Not on file  . Intimate partner violence    Fear of current or ex partner: Not on file    Emotionally abused: Not on file    Physically abused: Not on file    Forced sexual activity: Not on file  Other Topics Concern  . Not on file  Social History Narrative  . Not on file    Review of Systems  Constitution: Positive for malaise/fatigue. Negative for decreased appetite, weight gain and weight loss.  Eyes: Negative for visual disturbance.  Cardiovascular:  Negative for chest pain, claudication, dyspnea on exertion, leg swelling, orthopnea, palpitations and syncope.  Respiratory: Negative for hemoptysis and wheezing.   Endocrine: Negative for cold intolerance and heat intolerance.  Hematologic/Lymphatic: Does not bruise/bleed easily.  Skin: Negative for nail changes.  Musculoskeletal: Negative for muscle weakness and myalgias.  Gastrointestinal: Negative for abdominal pain, change in bowel habit, nausea and vomiting.  Neurological: Positive for headaches (chronic migraines ). Negative for difficulty with concentration, dizziness and focal weakness.  Psychiatric/Behavioral: Negative for altered mental status and suicidal ideas.  All other systems reviewed and are negative.     Objective:  Blood pressure 109/81, pulse 73, temperature (!) 96.7 F (35.9 C), height _0  (1.753 m), weight 150 lb 9.6 oz (68.3 kg), SpO2 98 %. Body mass index is 22.24 kg/m.    Physical Exam  Constitutional: She is oriented to person, place, and time. Vital signs are normal. She appears well-developed and well-nourished.  HENT:  Head: Normocephalic and atraumatic.  Neck: Normal range of motion.  Cardiovascular: Normal rate, regular rhythm, normal heart sounds and intact distal pulses.  Pulmonary/Chest: Effort normal and breath sounds normal. No accessory muscle usage. No respiratory distress.  Pectus excavatum   Abdominal: Soft. Bowel sounds are normal.  Musculoskeletal: Normal range of motion.  Neurological: She is alert and oriented to person, place, and time.  Skin: Skin is warm and dry.  Vitals reviewed.  Radiology: No results found.  Laboratory examination:   01/15/2019: Creatinine 1.23, EGFR 53, potassium 4.0, CMP otherwise normal.  CBC normal.  07/23/2015:Cholesterol 153, triglycerides 160, HDL 42, LDL 79.  CMP 12/05/2007  Glucose 150(H)  BUN 12  Creatinine 1.31(H)  Sodium 139  Potassium 3.2(L)  Chloride 104  CO2 25  Calcium 9.7  Total Protein  6.3  Total Bilirubin 0.4  Alkaline Phos 33(L)  AST 23  ALT 27   CBC 12/25/2007 12/24/2007 12/05/2007  WBC 10.3 8.1 7.4  Hemoglobin 12.1 12.5 13.1  Hematocrit 35.0(L) 36.5 37.4  Platelets 280 251 253   Lipid Panel  No results found for: CHOL, TRIG, HDL, CHOLHDL, VLDL, LDLCALC, LDLDIRECT HEMOGLOBIN A1C No results found for: HGBA1C, MPG TSH No results for input(s): TSH in the last 8760 hours.  PRN Meds:. Medications Discontinued During This Encounter  Medication Reason  . baclofen (LIORESAL) 10 MG tablet Error  . prochlorperazine (COMPAZINE) 10 MG tablet Error  . ALPRAZolam (XANAX) 0.5 MG tablet Error   Current Meds  Medication  Sig  . acebutolol (SECTRAL) 200 MG capsule TAKE 1 CAPSULE BY MOUTH TWICE A DAY  . buPROPion (WELLBUTRIN XL) 300 MG 24 hr tablet Take 300 mg by mouth daily.  Marland Kitchen desvenlafaxine (PRISTIQ) 100 MG 24 hr tablet Take 100 mg by mouth daily.   Marland Kitchen docusate sodium (COLACE) 100 MG capsule Take 100 mg by mouth 2 (two) times daily.  Marland Kitchen Ketotifen Fumarate (ITCHY EYE DROPS OP) Apply to eye.  Marland Kitchen MAGNESIUM PO Take 500 mg by mouth daily.   . Melatonin 300 MCG TABS Take by mouth.  . memantine (NAMENDA) 10 MG tablet Take 10 mg by mouth 2 (two) times daily.  . Riboflavin (VITAMIN B-2 PO) Take 1 tablet by mouth daily.   . rizatriptan (MAXALT) 10 MG tablet Take 10 mg by mouth as needed for migraine. May repeat in 2 hours if needed  . traZODone (DESYREL) 50 MG tablet Take 50 mg by mouth at bedtime.   . vitamin B-12 (CYANOCOBALAMIN) 500 MCG tablet Take 500 mcg by mouth daily.  . Vitamin D, Ergocalciferol, (DRISDOL) 1.25 MG (50000 UT) CAPS capsule Take 50,000 Units by mouth every 7 (seven) days.  Marland Kitchen zonisamide (ZONEGRAN) 100 MG capsule Take 400 mg by mouth daily.     Cardiac Studies:   Echocardiogram 04/16/2019: Left ventricle cavity is normal in size. Mild concentric hypertrophy of the left ventricle. Normal LV systolic function with EF 55%. Normal global wall motion. Doppler  evidence of grade I (impaired) diastolic dysfunction, normal LAP.  Trileaflet aortic valve. Moderate (Grade III) aortic regurgitation. Mechanism of regurgitation is unclear, bases on limited visualization of the aortic valve. No other significant valvular abnormality. Normal right atria pressure.   Assessment:     ICD-10-CM   1. Inappropriate sinus tachycardia  R00.0 PCV MYOCARDIAL PERFUSION WITH LEXISCAN  2. Malaise and fatigue  R53.81    R53.83   3. Moderate aortic regurgitation  I35.1   4. Migraine without aura and without status migrainosus, not intractable  G43.009   5. Vitamin D deficiency  E55.9   6. Dyspnea on exertion  R06.00 PCV MYOCARDIAL PERFUSION WITH LEXISCAN  7. Family history of early CAD  Z82.49 PCV MYOCARDIAL PERFUSION WITH LEXISCAN   EKG 04/01/2019: Sinus tachycardia at 101 bpm, normal axis, no evidence of ischemia.   Recommendations:   Patient is here for follow-up for inappropriate sinus tachycardia.  Heart rate has improved with acebutolol.  She does continue to have fatigue, but was found to have very low vitamin D levels and has just recently started vitamin D supplementation.  While her heart rate has significantly improved, she does continue to have the sensation of heart racing with doing activities.  I feel may be related to deconditioning.  I discussed options of changing her acebutolol to another medication.  We have decided to hold off for the next few weeks to see if her symptoms will improve with starting vitamin D supplements, and if no improvement will consider starting propanolol. She does have a history of migraine headaches that have improved.  She does have family history of early CAD, in view of her continued symptoms, will further evaluate with nuclear stress testing to exclude this as possible etiology. She had previously been scheduled for GXT that was cancelled in view of tachycardia. I will obtain lexiscan nuclear stress testing as she will be  unable to hold her beta blocker in view of tachycardia, also in view of her dyspnea on exertion, she will likely not be able to  exercise.   She does have moderate aortic regurgitation by echocardiogram performed in September.  She will need continued surveillance of this.  Do not feel that this is contributing to her symptoms.  I will plan to see her back in 3 months unless her stress test is abnormal, will see her sooner. I will notify her of the results. Encouraged her to try to do more activities throughout the day to build up her activity tolerance and to also try B6 and B12 vitamins.    Miquel Dunn, MSN, APRN, FNP-C [facility]. [place] Office: [phone] Fax: [phone]

## 2019-06-05 NOTE — Telephone Encounter (Signed)
From pt

## 2019-07-16 ENCOUNTER — Encounter (HOSPITAL_COMMUNITY): Payer: Self-pay | Admitting: Emergency Medicine

## 2019-07-16 ENCOUNTER — Emergency Department (HOSPITAL_COMMUNITY): Payer: PRIVATE HEALTH INSURANCE

## 2019-07-16 ENCOUNTER — Emergency Department (HOSPITAL_COMMUNITY)
Admission: EM | Admit: 2019-07-16 | Discharge: 2019-07-16 | Disposition: A | Discharge status: Home / Self Care | Payer: PRIVATE HEALTH INSURANCE | Attending: Emergency Medicine | Admitting: Emergency Medicine

## 2019-07-16 DIAGNOSIS — R5383 Other fatigue: Secondary | ICD-10-CM | POA: Diagnosis not present

## 2019-07-16 DIAGNOSIS — Z79899 Other long term (current) drug therapy: Secondary | ICD-10-CM | POA: Diagnosis not present

## 2019-07-16 DIAGNOSIS — Z87891 Personal history of nicotine dependence: Secondary | ICD-10-CM | POA: Insufficient documentation

## 2019-07-16 DIAGNOSIS — H532 Diplopia: Secondary | ICD-10-CM | POA: Diagnosis not present

## 2019-07-16 DIAGNOSIS — I10 Essential (primary) hypertension: Secondary | ICD-10-CM | POA: Diagnosis not present

## 2019-07-16 LAB — BASIC METABOLIC PANEL
Anion gap: 18 — ABNORMAL HIGH (ref 5–15)
BUN: 12 mg/dL (ref 6–20)
CO2: 17 mmol/L — ABNORMAL LOW (ref 22–32)
Calcium: 9.7 mg/dL (ref 8.9–10.3)
Chloride: 104 mmol/L (ref 98–111)
Creatinine, Ser: 1.23 mg/dL — ABNORMAL HIGH (ref 0.44–1.00)
GFR calc Af Amer: >60 mL/min (ref >60)
GFR calc non Af Amer: 53 mL/min — ABNORMAL LOW (ref >60)
Glucose, Bld: 101 mg/dL — ABNORMAL HIGH (ref 70–99)
Potassium: 4.2 mmol/L (ref 3.5–5.1)
Sodium: 139 mmol/L (ref 135–145)

## 2019-07-16 LAB — CBC
HCT: 44.3 % (ref 36.0–46.0)
Hemoglobin: 14.9 g/dL (ref 12.0–15.0)
MCH: 30.6 pg (ref 26.0–34.0)
MCHC: 33.6 g/dL (ref 30.0–36.0)
MCV: 91 fL (ref 80.0–100.0)
Platelets: 306 10*3/uL (ref 150–400)
RBC: 4.87 MIL/uL (ref 3.87–5.11)
RDW: 11.9 % (ref 11.5–15.5)
WBC: 6 10*3/uL (ref 4.0–10.5)
nRBC: 0 % (ref 0.0–0.2)

## 2019-07-16 LAB — TSH: TSH: 0.938 u[IU]/mL (ref 0.350–4.500)

## 2019-07-16 LAB — T4, FREE: Free T4: 1.02 ng/dL (ref 0.61–1.12)

## 2019-07-16 LAB — CBG MONITORING, ED: Glucose-Capillary: 95 mg/dL (ref 70–99)

## 2019-07-16 MED ORDER — SODIUM CHLORIDE 0.9 % IV BOLUS
1000.0000 mL | Freq: Once | INTRAVENOUS | Status: AC
  Administered 2019-07-16: 1000 mL via INTRAVENOUS

## 2019-07-16 NOTE — ED Triage Notes (Signed)
Per [facility] pt coming from home c/o fatigue x 5-6 days. Reports lack of appetite and increased sweating, feels like she is dehydrated. States she has lost 35 lbs since June.

## 2019-07-16 NOTE — ED Provider Notes (Addendum)
[facility]   CSN: [id number] Arrival date & time: 07/16/19  1155     History   Chief Complaint No chief complaint on file.   HPI Veronica Gates is a 43 y.o. female.     HPI  43 year old female presents with fatigue.  Has been ongoing for months since June 2020.  She states she had some sort of viral illness and then after this has been having this fatigue.  Hard for her to get out of bed.  She is also had excessive sweating, hair loss and weight loss.  She states she has been tested for thyroid disease and was negative.  No fevers.  Has chronic headaches that are unchanged from her typical migraines.  Has noticed blurry/double vision since March when she got new glasses.  There is no focal weakness but diffuse weakness.  No shortness of breath or chest pain.  No diarrhea or vomiting.  Past Medical History:  Diagnosis Date  . Anxiety   . Depression   . High cholesterol   . Hx of amenorrhea   . Hypertension   . Insomnia 2009  . Migraines   . Oligomenorrhea   . UTI (urinary tract infection) 2002  . Vitamin D deficiency 2008  . Yeast vaginitis 2002    Patient Active Problem List   Diagnosis Date Noted  . Upper airway cough syndrome 10/01/2015  . GERD without esophagitis 10/01/2015  . Hypertension   . High cholesterol   . Anxiety   . Depression   . Migraines   . Hx of amenorrhea   . Insomnia   . Oligomenorrhea   . Vitamin D deficiency   . Yeast vaginitis   . UTI (urinary tract infection)   . Constipation 02/10/2012  . Abdominal bloating 02/10/2012  . Irregular menses 01/19/2012  . Amenorrhea 01/19/2012    Past Surgical History:  Procedure Laterality Date  . CESAREAN SECTION    . OVARIAN CYST REMOVAL     twice     OB History    Gravida  3   Para  3   Term      Preterm      AB      Living  3     SAB      TAB      Ectopic      Multiple      Live Births               Home  Medications    Prior to Admission medications   Medication Sig Start Date End Date Taking? Authorizing Provider  acebutolol (SECTRAL) 200 MG capsule TAKE 1 CAPSULE BY MOUTH TWICE A DAY 05/21/19   Toniann FailKelley, Ashton Haynes, NP  buPROPion (WELLBUTRIN XL) 300 MG 24 hr tablet Take 300 mg by mouth daily.    [provider]  desvenlafaxine (PRISTIQ) 100 MG 24 hr tablet Take 100 mg by mouth daily.     [provider]  docusate sodium (COLACE) 100 MG capsule Take 100 mg by mouth 2 (two) times daily.    [provider]  Ketotifen Fumarate (ITCHY EYE DROPS OP) Apply to eye.    [provider]  MAGNESIUM PO Take 500 mg by mouth daily.     [provider]  Melatonin 300 MCG TABS Take by mouth.    [provider]  memantine (NAMENDA) 10 MG tablet Take 10 mg by mouth 2 (two) times daily.  [provider]  Riboflavin (VITAMIN B-2 PO) Take 1 tablet by mouth daily.     [provider]  rizatriptan (MAXALT) 10 MG tablet Take 10 mg by mouth as needed for migraine. May repeat in 2 hours if needed    [provider]  traZODone (DESYREL) 50 MG tablet Take 50 mg by mouth at bedtime.     [provider]  vitamin B-12 (CYANOCOBALAMIN) 500 MCG tablet Take 500 mcg by mouth daily.    [provider]  Vitamin D, Ergocalciferol, (DRISDOL) 1.25 MG (50000 UT) CAPS capsule Take 50,000 Units by mouth every 7 (seven) days.    [provider]  zonisamide (ZONEGRAN) 100 MG capsule Take 400 mg by mouth daily.  05/01/15   [provider]    Family History Family History  Problem Relation Age of Onset  . Diabetes Sister   . Hypertension Sister   . Hyperlipidemia Sister   . Heart disease Sister   . Thrombocytopenia Sister   . Thrombocytopenia Brother   . Gout Brother   . Hypertension Brother   . Hyperlipidemia Brother   . Diabetes type II Father   . Hypertension Father   . Heart disease Father   .  Thrombocytopenia Father   . Gout Father   . Throat cancer Mother        hpv  . Depression Mother   . Anxiety disorder Mother   . Anemia Mother   . Allergies Mother   . Asthma Son     Social History Social History   Tobacco Use  . Smoking status: Former Smoker    Packs/day: 0.75    Years: 10.00    Pack years: 7.50    Types: Cigarettes    Quit date: 08/16/1999    Years since quitting: 19.9  . Smokeless tobacco: Never Used  Substance Use Topics  . Alcohol use: No    Alcohol/week: 0.0 standard drinks  . Drug use: Not on file     Allergies   Floxin [ofloxacin] and Sumatriptan   Review of Systems Review of Systems  Constitutional: Positive for fatigue. Negative for fever.  Respiratory: Negative for shortness of breath.   Cardiovascular: Negative for chest pain.  Gastrointestinal: Negative for vomiting.  Neurological: Positive for headaches.  All other systems reviewed and are negative.    Physical Exam Updated Vital Signs BP 97/67   Pulse 61   Temp 98.8 F (37.1 C) (Oral)   Resp 12   SpO2 100%   Physical Exam Vitals and nursing note reviewed.  Constitutional:      Appearance: She is well-developed.  HENT:     Head: Normocephalic and atraumatic.     Right Ear: External ear normal.     Left Ear: External ear normal.     Nose: Nose normal.  Eyes:     General:        Right eye: No discharge.        Left eye: No discharge.  Cardiovascular:     Rate and Rhythm: Normal rate and regular rhythm.     Heart sounds: Normal heart sounds.  Pulmonary:     Effort: Pulmonary effort is normal.     Breath sounds: Normal breath sounds.  Abdominal:     Palpations: Abdomen is soft.     Tenderness: There is no abdominal tenderness.  Skin:    General: Skin is warm and dry.  Neurological:     Mental Status: She is alert.  Comments: CN 3-12 grossly intact. 5/5 strength in all 4 extremities. Grossly normal sensation. Normal finger to nose.   Psychiatric:        Mood  and Affect: Mood is not anxious.      ED Treatments / Results  Labs (all labs ordered are listed, but only abnormal results are displayed) Labs Reviewed  BASIC METABOLIC PANEL - Abnormal; Notable for the following components:      Result Value   CO2 17 (*)    Glucose, Bld 101 (*)    Creatinine, Ser 1.23 (*)    GFR calc non Af Amer 53 (*)    Anion gap 18 (*)    All other components within normal limits  CBC  TSH  T4, FREE  URINALYSIS, ROUTINE W REFLEX MICROSCOPIC  CBG MONITORING, ED  CBG MONITORING, ED    EKG EKG Interpretation  Date/Time:  Tuesday July 16 2019 13:13:44 EST Ventricular Rate:  62 PR Interval:    QRS Duration: 99 QT Interval:  442 QTC Calculation: 449 R Axis:   69 Text Interpretation: Sinus rhythm no acute ST/T changes similar to 2009 Confirmed by Pricilla Loveless [phone]) on 07/16/2019 2:36:35 PM   Radiology Ct Head Wo Contrast  Result Date: 07/16/2019 CLINICAL DATA:  Fatigue.  Lack of appetite.  Diplopia. EXAM: CT HEAD WITHOUT CONTRAST TECHNIQUE: Contiguous axial images were obtained from the base of the skull through the vertex without intravenous contrast. COMPARISON:  None. FINDINGS: Brain: The brainstem, cerebellum, cerebral peduncles, thalami, basal ganglia, basilar cisterns, and ventricular system appear within normal limits. No intracranial hemorrhage, mass lesion, or acute CVA. Vascular: The middle cerebral arteries appear symmetric on image 41/5. 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 IMPRESSION: 1. No acute intracranial findings. 2. 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 Electronically Signed   By: Gaylyn Rong M.D.   On: 07/16/2019 14:54    Procedures Procedures (including critical care time)  Medications  Ordered in ED Medications  sodium chloride 0.9 % bolus 1,000 mL (1,000 mLs Intravenous New Bag/Given 07/16/19 1308)     Initial Impression / Assessment and Plan / ED Course  I have reviewed the triage vital signs and the nursing notes.  Pertinent labs & imaging results that were available during my care of the patient were reviewed by me and considered in my medical decision making (see chart for details).        No clear cause for the patient's fatigue.  Thyroid studies are benign.  Labs reassuring including normal WBC.  She does have a low bicarbonate level, which causes an elevated anion gap but it has been this way on previous chemistries since 2019. Patient's work-up is otherwise negative.  CT head benign, obtained given chronic headaches and vision changes, though this is probably related to her poor eyesight overall.  Will refer back to PCP.  No obvious infectious symptoms.  Final Clinical Impressions(s) / ED Diagnoses   Final diagnoses:  Fatigue, unspecified type    ED Discharge Orders    None       Pricilla Loveless, MD 07/16/19 1514    Pricilla Loveless, MD 07/31/19 [phone]

## 2019-07-16 NOTE — ED Notes (Signed)
Pt transported to CT ?

## 2019-07-16 NOTE — ED Notes (Signed)
Pt d/c home per MD order. Discharge summary reviewed, pt verbalizes understanding. Pt ambulatory off unit, reports discharge ride home,.

## 2019-09-03 ENCOUNTER — Other Ambulatory Visit: Payer: Self-pay

## 2019-09-03 ENCOUNTER — Encounter: Payer: Self-pay | Admitting: Cardiology

## 2019-09-03 ENCOUNTER — Ambulatory Visit (INDEPENDENT_AMBULATORY_CARE_PROVIDER_SITE_OTHER): Payer: 59 | Admitting: Cardiology

## 2019-09-03 VITALS — BP 109/76 | HR 57 | Temp 98.4°F | Ht 69.0 in | Wt 144.0 lb

## 2019-09-03 DIAGNOSIS — R000 Tachycardia, unspecified: Secondary | ICD-10-CM | POA: Diagnosis not present

## 2019-09-03 DIAGNOSIS — Z8249 Family history of ischemic heart disease and other diseases of the circulatory system: Secondary | ICD-10-CM | POA: Diagnosis not present

## 2019-09-03 DIAGNOSIS — R5381 Other malaise: Secondary | ICD-10-CM | POA: Diagnosis not present

## 2019-09-03 DIAGNOSIS — R0609 Other forms of dyspnea: Secondary | ICD-10-CM

## 2019-09-03 DIAGNOSIS — R5383 Other fatigue: Secondary | ICD-10-CM

## 2019-09-03 DIAGNOSIS — I4711 Inappropriate sinus tachycardia, so stated: Secondary | ICD-10-CM

## 2019-09-03 NOTE — Progress Notes (Signed)
Primary Physician:  Chesley Noon, MD   Patient ID: Veronica Gates, female    DOB: September 08, 1975, 44 y.o.   MRN: [id number]  Subjective:    Chief Complaint  Patient presents with  . Tachycardia  . Hypertension    HPI: Veronica Gates  is a 44 y.o. female  with history of chronic migranes, hypertension not on therapy, hyperlipidemia, recently evaluated by [place] for tachycardia and fatigue.  She reports in June, she began having fatigue to point that she could not getting out of bed, but before this was not feeling well all the way back to Nov 2019 when she was taken out of work due to daily migraines. She noticed that she has had pressure on her chest and could not physically get out of bed due to this. She was found to have severe low vitamin D level, that continues to be low. She was found to have elevated heart rate with minimal activity and even at rest; therefore, her symptoms felt to be related to inappropriate sinus tachycardia. She was started on Acebutolol, in which her heart rate has improved; however, she continues to have symptoms.   Since last seen 3 months ago, she continues to have a sensation of heart racing with doing activities, but states that her heart rate does not elevate.  She continues to have fatigue and is trying to push herself to do more. Also still has dyspnea on exertion. States that she has to pace herself doing activities around her house. She has been evaluated by ID, symptoms felt to be potentially related to chronic fatigue.   She has been evaluated by sleep medicine, did not feel related to sleep. She has previously had hypertension; however, with weight loss that this improved.  Older sister had MI and CABG in her 26's. Father also had history of heart disease. She is a former smoker, but quit 20 years ago. No alcohol or drug use.  Past Medical History:  Diagnosis Date  . Anxiety   . Depression   . High cholesterol   . Hx of amenorrhea   .  Hypertension   . Insomnia 2009  . Migraines   . Oligomenorrhea   . UTI (urinary tract infection) 2002  . Vitamin D deficiency 2008  . Yeast vaginitis 2002    Past Surgical History:  Procedure Laterality Date  . CESAREAN SECTION    . OVARIAN CYST REMOVAL     twice    Social History   Socioeconomic History  . Marital status: Married    Spouse name: Not on file  . Number of children: 3  . Years of education: `  . Highest education level: Not on file  Occupational History  . Not on file  Tobacco Use  . Smoking status: Former Smoker    Packs/day: 0.75    Years: 10.00    Pack years: 7.50    Types: Cigarettes    Quit date: 08/16/1999    Years since quitting: 20.0  . Smokeless tobacco: Never Used  Substance and Sexual Activity  . Alcohol use: No    Alcohol/week: 0.0 standard drinks  . Drug use: Not on file  . Sexual activity: Yes    Birth control/protection: None  Other Topics Concern  . Not on file  Social History Narrative  . Not on file   Social Determinants of Health   Financial Resource Strain:   . Difficulty of Paying Living Expenses: Not on file  Food Insecurity:   .  Worried About Charity fundraiser in the Last Year: Not on file  . Ran Out of Food in the Last Year: Not on file  Transportation Needs:   . Lack of Transportation (Medical): Not on file  . Lack of Transportation (Non-Medical): Not on file  Physical Activity:   . Days of Exercise per Week: Not on file  . Minutes of Exercise per Session: Not on file  Stress:   . Feeling of Stress : Not on file  Social Connections:   . Frequency of Communication with Friends and Family: Not on file  . Frequency of Social Gatherings with Friends and Family: Not on file  . Attends Religious Services: Not on file  . Active Member of Clubs or Organizations: Not on file  . Attends Archivist Meetings: Not on file  . Marital Status: Not on file  Intimate Partner Violence:   . Fear of Current or  Ex-Partner: Not on file  . Emotionally Abused: Not on file  . Physically Abused: Not on file  . Sexually Abused: Not on file    Review of Systems  Constitution: Positive for malaise/fatigue. Negative for decreased appetite, weight gain and weight loss.  Eyes: Negative for visual disturbance.  Cardiovascular: Positive for dyspnea on exertion. Negative for chest pain, claudication, leg swelling, orthopnea, palpitations and syncope.  Respiratory: Negative for hemoptysis and wheezing.   Endocrine: Negative for cold intolerance and heat intolerance.  Hematologic/Lymphatic: Does not bruise/bleed easily.  Skin: Negative for nail changes.  Musculoskeletal: Negative for muscle weakness and myalgias.  Gastrointestinal: Negative for abdominal pain, change in bowel habit, nausea and vomiting.  Neurological: Positive for headaches (improved; chronic migraines ). Negative for difficulty with concentration, dizziness and focal weakness.  Psychiatric/Behavioral: Negative for altered mental status and suicidal ideas.  All other systems reviewed and are negative.     Objective:  Blood pressure 109/76, pulse (!) 57, temperature 98.4 F (36.9 C), height '5\' 9"'  (1.753 m), weight 144 lb (65.3 kg), SpO2 99 %. Body mass index is 21.27 kg/m.    Physical Exam  Constitutional: She is oriented to person, place, and time. Vital signs are normal. She appears well-developed and well-nourished.  HENT:  Head: Normocephalic and atraumatic.  Cardiovascular: Normal rate, regular rhythm, normal heart sounds and intact distal pulses.  Pulmonary/Chest: Effort normal and breath sounds normal. No accessory muscle usage. No respiratory distress.  Pectus excavatum   Abdominal: Soft. Bowel sounds are normal.  Musculoskeletal:        General: Normal range of motion.     Cervical back: Normal range of motion.  Neurological: She is alert and oriented to person, place, and time.  Skin: Skin is warm and dry.  Vitals reviewed.   Radiology: No results found.  Laboratory examination:   01/15/2019: Creatinine 1.23, EGFR 53, potassium 4.0, CMP otherwise normal.  CBC normal.  07/23/2015:Cholesterol 153, triglycerides 160, HDL 42, LDL 79.  CMP Latest Ref Rng & Units 07/16/2019 12/05/2007  Glucose 70 - 99 mg/dL 101(H) 150(H)  BUN 6 - 20 mg/dL 12 12  Creatinine 0.44 - 1.00 mg/dL 1.23(H) 1.31(H)  Sodium 135 - 145 mmol/L 139 139  Potassium 3.5 - 5.1 mmol/L 4.2 3.2(L)  Chloride 98 - 111 mmol/L 104 104  CO2 22 - 32 mmol/L 17(L) 25  Calcium 8.9 - 10.3 mg/dL 9.7 9.7  Total Protein - - 6.3  Total Bilirubin - - 0.4  Alkaline Phos - - 33(L)  AST - - 23  ALT - -  27   CBC Latest Ref Rng & Units 07/16/2019 12/25/2007 12/24/2007  WBC 4.0 - 10.5 K/uL 6.0 10.3 8.1  Hemoglobin 12.0 - 15.0 g/dL 14.9 12.1 12.5  Hematocrit 36.0 - 46.0 % 44.3 35.0(L) 36.5  Platelets 150 - 400 K/uL 306 280 251   Lipid Panel  No results found for: CHOL, TRIG, HDL, CHOLHDL, VLDL, LDLCALC, LDLDIRECT HEMOGLOBIN A1C No results found for: HGBA1C, MPG TSH Recent Labs    07/16/19 1307  TSH 0.938    PRN Meds:. Medications Discontinued During This Encounter  Medication Reason  . Melatonin 300 MCG TABS Error   Current Meds  Medication Sig  . acebutolol (SECTRAL) 200 MG capsule TAKE 1 CAPSULE BY MOUTH TWICE A DAY (Patient taking differently: daily. )  . buPROPion (WELLBUTRIN XL) 300 MG 24 hr tablet Take 300 mg by mouth daily.  Marland Kitchen desvenlafaxine (PRISTIQ) 100 MG 24 hr tablet Take 100 mg by mouth daily.   Marland Kitchen docusate sodium (COLACE) 100 MG capsule Take 100 mg by mouth 2 (two) times daily.  Marland Kitchen esomeprazole (NEXIUM) 20 MG capsule Take 20 mg by mouth daily at 12 noon.  Marland Kitchen Ketotifen Fumarate (ITCHY EYE DROPS OP) Apply to eye.  Marland Kitchen MAGNESIUM PO Take 500 mg by mouth daily.   . memantine (NAMENDA) 10 MG tablet Take 10 mg by mouth 2 (two) times daily.  . Riboflavin (VITAMIN B-2 PO) Take 1 tablet by mouth daily.   . rizatriptan (MAXALT) 10 MG tablet Take 10 mg by  mouth as needed for migraine. May repeat in 2 hours if needed  . traZODone (DESYREL) 50 MG tablet Take 50 mg by mouth at bedtime.   . vitamin B-12 (CYANOCOBALAMIN) 500 MCG tablet Take 500 mcg by mouth daily.  . Vitamin D, Ergocalciferol, (DRISDOL) 1.25 MG (50000 UT) CAPS capsule Take 50,000 Units by mouth every 7 (seven) days.  Marland Kitchen zonisamide (ZONEGRAN) 100 MG capsule Take 500 mg by mouth daily.     Cardiac Studies:   Echocardiogram 04/16/2019: Left ventricle cavity is normal in size. Mild concentric hypertrophy of the left ventricle. Normal LV systolic function with EF 55%. Normal global wall motion. Doppler evidence of grade I (impaired) diastolic dysfunction, normal LAP.  Trileaflet aortic valve. Moderate (Grade III) aortic regurgitation. Mechanism of regurgitation is unclear, bases on limited visualization of the aortic valve. No other significant valvular abnormality. Normal right atria pressure.   Assessment:     ICD-10-CM   1. Inappropriate sinus tachycardia  R00.0 EKG 12-Lead  2. Dyspnea on exertion  R06.00 PCV MYOCARDIAL PERFUSION WITH LEXISCAN  3. Malaise and fatigue  R53.81    R53.83   4. Family history of early CAD  Z58.49 Lipid Profile   EKG 09/03/2019: Normal sinus rhythm at 65 bpm, left atrial abnormality, normal axis, no evidence of ischemia.   Recommendations:   Patient is here for 69-monthoffice visit and follow-up for inappropriate sinus tachycardia.  Her symptoms have been stable.  Reports no worsening symptoms, but also no improvement.  Her heart rate has improved since being on acebutolol daily, will continue the same.  She does continue to complain of dyspnea on exertion, fatigue, and feeling like her heart is racing with activity, but is actually normal.  She does have family history of early CAD.  Will recommend nuclear stress testing for further evaluation given her continued persistent symptoms as well as her family history.  We will schedule this today.  She is  unable to walk on the treadmill due to  dyspnea on exertion and fatigue as well as inability to hold acebutolol.  She previously was on simvastatin, she has been off medication for the last several months.  I do not have recent lipid panel, will obtain this in the next few days for further risk stratification.  I will see her back after her stress test for follow-up.   Miquel Dunn, MSN, APRN, FNP-C [facility]. [place] Office: [phone] Fax: [phone]

## 2019-09-09 ENCOUNTER — Ambulatory Visit (INDEPENDENT_AMBULATORY_CARE_PROVIDER_SITE_OTHER): Payer: 59

## 2019-09-09 ENCOUNTER — Other Ambulatory Visit: Payer: Self-pay

## 2019-09-09 DIAGNOSIS — R0789 Other chest pain: Secondary | ICD-10-CM

## 2019-09-09 DIAGNOSIS — R0609 Other forms of dyspnea: Secondary | ICD-10-CM

## 2019-09-09 DIAGNOSIS — R0602 Shortness of breath: Secondary | ICD-10-CM | POA: Diagnosis not present

## 2019-09-16 DIAGNOSIS — M797 Fibromyalgia: Secondary | ICD-10-CM

## 2019-09-16 HISTORY — DX: Fibromyalgia: M79.7

## 2019-10-01 ENCOUNTER — Ambulatory Visit: Payer: Managed Care, Other (non HMO) | Admitting: Cardiology

## 2019-10-03 LAB — LIPID PANEL
Chol/HDL Ratio: 5.7 ratio — ABNORMAL HIGH (ref 0.0–4.4)
Cholesterol, Total: 234 mg/dL — ABNORMAL HIGH (ref 100–199)
HDL: 41 mg/dL (ref >39)
LDL Chol Calc (NIH): 127 mg/dL — ABNORMAL HIGH (ref 0–99)
Triglycerides: 369 mg/dL — ABNORMAL HIGH (ref 0–149)
VLDL Cholesterol Cal: 66 mg/dL — ABNORMAL HIGH (ref 5–40)

## 2019-10-07 ENCOUNTER — Ambulatory Visit: Payer: 59 | Admitting: Cardiology

## 2019-10-07 ENCOUNTER — Encounter: Payer: Self-pay | Admitting: Cardiology

## 2019-10-07 ENCOUNTER — Other Ambulatory Visit: Payer: Self-pay

## 2019-10-07 VITALS — BP 101/78 | HR 87 | Temp 97.6°F | Ht 69.0 in | Wt 146.4 lb

## 2019-10-07 DIAGNOSIS — R000 Tachycardia, unspecified: Secondary | ICD-10-CM

## 2019-10-07 DIAGNOSIS — I4711 Inappropriate sinus tachycardia, so stated: Secondary | ICD-10-CM

## 2019-10-07 DIAGNOSIS — R5383 Other fatigue: Secondary | ICD-10-CM

## 2019-10-07 DIAGNOSIS — R5381 Other malaise: Secondary | ICD-10-CM

## 2019-10-07 DIAGNOSIS — E782 Mixed hyperlipidemia: Secondary | ICD-10-CM

## 2019-10-07 DIAGNOSIS — R0609 Other forms of dyspnea: Secondary | ICD-10-CM

## 2019-10-07 MED ORDER — SIMVASTATIN 40 MG PO TABS: 40.0000 mg | ORAL_TABLET | Freq: Every day | ORAL | 1 refills | Status: DC

## 2019-10-07 NOTE — Progress Notes (Signed)
Primary Physician:  Chesley Noon, MD   Patient ID: Veronica Gates, female    DOB: 1975-11-30, 44 y.o.   MRN: [id number]  Subjective:    Chief Complaint  Patient presents with  . Shortness of Breath  . Results    stress test  . Follow-up    4wk    HPI: Veronica Gates  is a 44 y.o. female  with history of chronic migranes, hypertension not on therapy, hyperlipidemia, family history of early CAD in older sister (CABG in her 75's) and her father, former tobacco use, followed by [place] for inappropriate sinus tachycardia. Since being on Acebutolol, heart rate has improved; however, continues to have fatigue and dyspnea on exertion. She underwent lexiscan nuclear stress test and now presents for follow up.  During the interim, she has been evaluated by Rheumatology and has been found to have fibromyalgia. She is now on Lyrica. Heart rate continues to be stable, but continues to feel like her heart races with activities, although is normal by her watch. Dyspnea on exertion and fatigue are unchanged. No chest pain.   She has recently had lipids performed while being off her statin.  She has been evaluated by sleep medicine, did not feel related to sleep. She has previously had hypertension; however, with weight loss that this improved.   Past Medical History:  Diagnosis Date  . Anxiety   . Depression   . High cholesterol   . Hx of amenorrhea   . Hypertension   . Insomnia 2009  . Migraines   . Oligomenorrhea   . UTI (urinary tract infection) 2002  . Vitamin D deficiency 2008  . Yeast vaginitis 2002    Past Surgical History:  Procedure Laterality Date  . CESAREAN SECTION    . OVARIAN CYST REMOVAL     twice    Social History   Socioeconomic History  . Marital status: Married    Spouse name: Not on file  . Number of children: 3  . Years of education: `  . Highest education level: Not on file  Occupational History  . Not on file  Tobacco Use  . Smoking status:  Former Smoker    Packs/day: 0.75    Years: 10.00    Pack years: 7.50    Types: Cigarettes    Quit date: 08/16/1999    Years since quitting: 20.1  . Smokeless tobacco: Never Used  Substance and Sexual Activity  . Alcohol use: No    Alcohol/week: 0.0 standard drinks  . Drug use: Not on file  . Sexual activity: Yes    Birth control/protection: None  Other Topics Concern  . Not on file  Social History Narrative  . Not on file   Social Determinants of Health   Financial Resource Strain:   . Difficulty of Paying Living Expenses: Not on file  Food Insecurity:   . Worried About Charity fundraiser in the Last Year: Not on file  . Ran Out of Food in the Last Year: Not on file  Transportation Needs:   . Lack of Transportation (Medical): Not on file  . Lack of Transportation (Non-Medical): Not on file  Physical Activity:   . Days of Exercise per Week: Not on file  . Minutes of Exercise per Session: Not on file  Stress:   . Feeling of Stress : Not on file  Social Connections:   . Frequency of Communication with Friends and Family: Not on file  . Frequency  of Social Gatherings with Friends and Family: Not on file  . Attends Religious Services: Not on file  . Active Member of Clubs or Organizations: Not on file  . Attends Archivist Meetings: Not on file  . Marital Status: Not on file  Intimate Partner Violence:   . Fear of Current or Ex-Partner: Not on file  . Emotionally Abused: Not on file  . Physically Abused: Not on file  . Sexually Abused: Not on file    Review of Systems  Constitution: Positive for malaise/fatigue. Negative for decreased appetite, weight gain and weight loss.  Eyes: Negative for visual disturbance.  Cardiovascular: Positive for dyspnea on exertion. Negative for chest pain, claudication, leg swelling, orthopnea, palpitations and syncope.  Respiratory: Negative for hemoptysis and wheezing.   Endocrine: Negative for cold intolerance and heat  intolerance.  Hematologic/Lymphatic: Does not bruise/bleed easily.  Skin: Negative for nail changes.  Musculoskeletal: Negative for muscle weakness and myalgias.  Gastrointestinal: Negative for abdominal pain, change in bowel habit, nausea and vomiting.  Neurological: Positive for headaches (improved; chronic migraines ). Negative for difficulty with concentration, dizziness and focal weakness.  Psychiatric/Behavioral: Negative for altered mental status and suicidal ideas.  All other systems reviewed and are negative.     Objective:  Blood pressure 101/78, pulse 87, temperature 97.6 F (36.4 C), height '5\' 9"'$  (1.753 m), weight 146 lb 6.4 oz (66.4 kg), SpO2 98 %. Body mass index is 21.62 kg/m.    Physical Exam  Constitutional: She is oriented to person, place, and time. Vital signs are normal. She appears well-developed and well-nourished.  HENT:  Head: Normocephalic and atraumatic.  Cardiovascular: Normal rate, regular rhythm, normal heart sounds and intact distal pulses.  Pulmonary/Chest: Effort normal and breath sounds normal. No accessory muscle usage. No respiratory distress.  Pectus excavatum   Abdominal: Soft. Bowel sounds are normal.  Musculoskeletal:        General: Normal range of motion.     Cervical back: Normal range of motion.  Neurological: She is alert and oriented to person, place, and time.  Skin: Skin is warm and dry.  Vitals reviewed.  Radiology: No results found.  Laboratory examination:   01/15/2019: Creatinine 1.23, EGFR 53, potassium 4.0, CMP otherwise normal.  CBC normal.  07/23/2015:Cholesterol 153, triglycerides 160, HDL 42, LDL 79.  CMP Latest Ref Rng & Units 07/16/2019 12/05/2007  Glucose 70 - 99 mg/dL 101(H) 150(H)  BUN 6 - 20 mg/dL 12 12  Creatinine 0.44 - 1.00 mg/dL 1.23(H) 1.31(H)  Sodium 135 - 145 mmol/L 139 139  Potassium 3.5 - 5.1 mmol/L 4.2 3.2(L)  Chloride 98 - 111 mmol/L 104 104  CO2 22 - 32 mmol/L 17(L) 25  Calcium 8.9 - 10.3 mg/dL 9.7  9.7  Total Protein - - 6.3  Total Bilirubin - - 0.4  Alkaline Phos - - 33(L)  AST - - 23  ALT - - 27   CBC Latest Ref Rng & Units 07/16/2019 12/25/2007 12/24/2007  WBC 4.0 - 10.5 K/uL 6.0 10.3 8.1  Hemoglobin 12.0 - 15.0 g/dL 14.9 12.1 12.5  Hematocrit 36.0 - 46.0 % 44.3 35.0(L) 36.5  Platelets 150 - 400 K/uL 306 280 251   Lipid Panel     Component Value Date/Time   CHOL 234 (H) 10/02/2019 0939   TRIG 369 (H) 10/02/2019 0939   HDL 41 10/02/2019 0939   CHOLHDL 5.7 (H) 10/02/2019 0939   LDLCALC 127 (H) 10/02/2019 0939   HEMOGLOBIN A1C No results found for:  HGBA1C, MPG TSH Recent Labs    07/16/19 1307  TSH 0.938    PRN Meds:. There are no discontinued medications. Current Meds  Medication Sig  . acebutolol (SECTRAL) 200 MG capsule TAKE 1 CAPSULE BY MOUTH TWICE A DAY (Patient taking differently: daily. )  . buPROPion (WELLBUTRIN XL) 300 MG 24 hr tablet Take 300 mg by mouth daily.  . cyclobenzaprine (FLEXERIL) 10 MG tablet daily.  Marland Kitchen desvenlafaxine (PRISTIQ) 100 MG 24 hr tablet Take 100 mg by mouth daily.   Marland Kitchen docusate sodium (COLACE) 100 MG capsule Take 100 mg by mouth 2 (two) times daily.  Marland Kitchen esomeprazole (NEXIUM) 20 MG capsule Take 20 mg by mouth daily at 12 noon.  Marland Kitchen estradiol (CLIMARA - DOSED IN MG/24 HR) 0.075 mg/24hr patch Place 0.075 mg onto the skin once a week.  Marland Kitchen Ketotifen Fumarate (ITCHY EYE DROPS OP) Apply to eye. PRN  . MAGNESIUM PO Take 500 mg by mouth daily.   . memantine (NAMENDA) 10 MG tablet Take 10 mg by mouth 2 (two) times daily.  . pregabalin (LYRICA) 75 MG capsule daily.  . Riboflavin (VITAMIN B-2 PO) Take 1 tablet by mouth daily.   . rizatriptan (MAXALT) 10 MG tablet Take 10 mg by mouth as needed for migraine. May repeat in 2 hours if needed  . traZODone (DESYREL) 50 MG tablet Take 50 mg by mouth at bedtime.   . vitamin B-12 (CYANOCOBALAMIN) 500 MCG tablet Take 500 mcg by mouth daily.  . Vitamin D, Ergocalciferol, (DRISDOL) 1.25 MG (50000 UT) CAPS capsule  Take 50,000 Units by mouth every 7 (seven) days.  Marland Kitchen zonisamide (ZONEGRAN) 100 MG capsule Take 500 mg by mouth daily.     Cardiac Studies:   Echocardiogram 04/16/2019: Left ventricle cavity is normal in size. Mild concentric hypertrophy of the left ventricle. Normal LV systolic function with EF 55%. Normal global wall motion. Doppler evidence of grade I (impaired) diastolic dysfunction, normal LAP.  Trileaflet aortic valve. Moderate (Grade III) aortic regurgitation. Mechanism of regurgitation is unclear, bases on limited visualization of the aortic valve. No other significant valvular abnormality. Normal right atria pressure.   Lexiscan (Walking with Jaci Carrel) Sestamibi Stress Test 09/09/2019: Nondiagnostic ECG stress. Normal myocardial perfusion. All segments of left ventricle demonstrated normal wall motion and thickening. Stress LV EF is normal 69%.  No previous exam available for comparison. Low risk study.   Assessment:     ICD-10-CM   1. Inappropriate sinus tachycardia  R00.0   2. Dyspnea on exertion  R06.00   3. Malaise and fatigue  R53.81    R53.83   4. Mixed hyperlipidemia  E78.2 Lipid Profile    Comprehensive Metabolic Panel (CMET)   EKG 09/03/2019: Normal sinus rhythm at 65 bpm, left atrial abnormality, normal axis, no evidence of ischemia.   Recommendations:   Veronica Gates  is a 44 y.o. female  with history of chronic migranes, hypertension not on therapy, hyperlipidemia, family history of early CAD in older sister (CABG in her 86's) and her father, former tobacco use, followed by [place] for inappropriate sinus tachycardia. Since being on Acebutolol, heart rate has improved; however, continues to have fatigue and dyspnea on exertion. She underwent lexiscan nuclear stress test and now presents for follow up.  I reviewed and discussed her recent nuclear stress test results, no evidence of ischemia.  Dyspnea on exertion has been stable. During the interim, she has been  found to have fibromyalgia and has recently been started on Lyrica.  We have discussed ways to start exercise to help with this. From inappropriate sinus tachycardia standpoint, her heart rate has been stable.  Hopefully regular exercise will also help with this.  Continue with acebutolol.  I have reviewed her recent lipids, lipids are uncontrolled with being off of simvastatin.  She does have family history of early CAD.  I recommend that she resume simvastatin 40 mg daily.  Should she have worsening fatigue or myalgias symptoms, will consider trying a different statin.  Will check lipid panel and CMP in 3 months for follow-up.  We will plan to see her back in 6 months for follow-up, but encouraged her to contact me sooner if needed.   Miquel Dunn, MSN, APRN, FNP-C [facility]. [place] Office: [phone] Fax: [phone]

## 2019-12-12 ENCOUNTER — Telehealth: Payer: Self-pay | Admitting: Cardiology

## 2019-12-12 NOTE — Telephone Encounter (Signed)
I had received a request for dictating the patient disabled due to multiple medical issues.  I reviewed the chart, from cardiac standpoint at least as she has remained stable I could not make the deterioration form that they had issue to me, and I called and spoke to her legal lawyer Ms.Leila Louzri..  She stated that she does not need my statement and thanked me for calling her back.  I spoke to her 2 days ago.  Yates Decamp, MD, [facility] 12/12/2019, 5:09 PM [facility] [place] Office: [phone]

## 2019-12-20 ENCOUNTER — Telehealth: Payer: Self-pay

## 2019-12-20 NOTE — Telephone Encounter (Signed)
Please inform patient that I did speak to the attorney. I am unable to sign the form as I am not qualified to declare her disabled and there is no documentation in our charts to that degree and hence this letter is not signed. After I spoke to the attorney that I am not allowed and will not be able to sign the papers, attorney agreed as to my reasons. JG

## 2019-12-20 NOTE — Telephone Encounter (Signed)
Will it be ok to to put this on letterhead and send to attorney?

## 2019-12-20 NOTE — Telephone Encounter (Signed)
Please inform patient that I did speak to the attorney. I am unable to sign the form as I am not qualified to declare her disabled and there is no documentation in our charts to that degree and hence this letter is not signed. After I spoke to the attorney that I am not allowed and will not be able to sign the papers, attorney agreed as to my reasons. JG  I will send this message to patient on My Chart as well. J

## 2019-12-20 NOTE — Telephone Encounter (Signed)
It will not help them. Inform the patient. I will also send My Chart Message now.

## 2020-01-30 ENCOUNTER — Other Ambulatory Visit: Payer: Self-pay

## 2020-01-30 ENCOUNTER — Ambulatory Visit: Payer: 59 | Admitting: Cardiology

## 2020-01-30 ENCOUNTER — Encounter: Payer: Self-pay | Admitting: Cardiology

## 2020-01-30 ENCOUNTER — Ambulatory Visit: Payer: Managed Care, Other (non HMO) | Admitting: Cardiology

## 2020-01-30 VITALS — BP 113/82 | HR 52 | Resp 15 | Ht 69.0 in | Wt 163.4 lb

## 2020-01-30 DIAGNOSIS — R5381 Other malaise: Secondary | ICD-10-CM

## 2020-01-30 DIAGNOSIS — R0609 Other forms of dyspnea: Secondary | ICD-10-CM

## 2020-01-30 DIAGNOSIS — I351 Nonrheumatic aortic (valve) insufficiency: Secondary | ICD-10-CM

## 2020-01-30 DIAGNOSIS — R000 Tachycardia, unspecified: Secondary | ICD-10-CM

## 2020-01-30 DIAGNOSIS — E782 Mixed hyperlipidemia: Secondary | ICD-10-CM

## 2020-01-30 DIAGNOSIS — I4711 Inappropriate sinus tachycardia, so stated: Secondary | ICD-10-CM

## 2020-01-30 NOTE — Progress Notes (Signed)
Primary Physician:  Chesley Noon, MD   Patient ID: Veronica Gates, female    DOB: [birth year], 44 y.o.   MRN: [id number]  Subjective:    Chief Complaint  Patient presents with  . Follow-up    6 month  . Hyperlipidemia  . IST    HPI: Veronica Gates  is a 44 y.o. female  with history of chronic migranes, hypertension, hyperlipidemia, family history of early CAD in older sister (CABG in her 41's) and her father, former tobacco use, followed by [place] for inappropriate sinus tachycardia. Since being on Acebutolol, heart rate has improved; however, continues to have fatigue and dyspnea on exertion.  During the interim, she has been evaluated by Rheumatology and has been found to have fibromyalgia. She is now out of her job due to continued marked fatigue. Tolerating Bisoprolol and Simvastatin without side-effects.   Past Medical History:  Diagnosis Date  . Anxiety   . Depression   . Fibromyalgia 09/2019  . High cholesterol   . Hx of amenorrhea   . Hypertension   . Insomnia 2009  . Migraines   . Oligomenorrhea   . UTI (urinary tract infection) 2002  . Vitamin D deficiency 2008  . Yeast vaginitis 2002    Past Surgical History:  Procedure Laterality Date  . CESAREAN SECTION    . OVARIAN CYST REMOVAL     twice   Social History   Tobacco Use  . Smoking status: Former Smoker    Packs/day: 0.75    Years: 10.00    Pack years: 7.50    Types: Cigarettes    Quit date: 08/16/1999    Years since quitting: 20.4  . Smokeless tobacco: Never Used  Substance Use Topics  . Alcohol use: No    Alcohol/week: 0.0 standard drinks   Marital Status: Married   Review of Systems  Constitutional: Positive for malaise/fatigue.  Cardiovascular: Positive for dyspnea on exertion (stable). Negative for chest pain and leg swelling.  Gastrointestinal: Negative for melena.  Neurological: Positive for headaches.  Psychiatric/Behavioral: Positive for depression.   Objective:  Blood  pressure 113/82, pulse (!) 52, resp. rate 15, height _0  (1.753 m), weight 163 lb 6.4 oz (74.1 kg), SpO2 98 %. Body mass index is 24.13 kg/m.  Vitals with BMI 01/30/2020 10/07/2019 09/03/2019  Height _1  _2  _3   Weight 163 lbs 6 oz 146 lbs 6 oz 144 lbs  BMI 24.12 35.45 62.56  Systolic 389 373 428  Diastolic 82 78 76  Pulse 52 87 57    Physical Exam Cardiovascular:     Rate and Rhythm: Normal rate and regular rhythm.     Pulses: Intact distal pulses.     Heart sounds: Normal heart sounds. No murmur heard.  No gallop.      Comments: No leg edema, no JVD. Pulmonary:     Effort: Pulmonary effort is normal.     Breath sounds: Normal breath sounds.  Chest:     Chest wall: Deformity (pectus excavatum) present.  Abdominal:     General: Bowel sounds are normal.     Palpations: Abdomen is soft.    Laboratory examination:   01/15/2019: Creatinine 1.23, EGFR 53, potassium 4.0, CMP otherwise normal.  CBC normal.  07/23/2015:Cholesterol 153, triglycerides 160, HDL 42, LDL 79.  CMP Latest Ref Rng & Units 07/16/2019 12/05/2007  Glucose 70 - 99 mg/dL 101(H) 150(H)  BUN 6 - 20 mg/dL 12 12  Creatinine 0.44 - 1.00 mg/dL  1.23(H) 1.31(H)  Sodium 135 - 145 mmol/L 139 139  Potassium 3.5 - 5.1 mmol/L 4.2 3.2(L)  Chloride 98 - 111 mmol/L 104 104  CO2 22 - 32 mmol/L 17(L) 25  Calcium 8.9 - 10.3 mg/dL 9.7 9.7  Total Protein - - 6.3  Total Bilirubin - - 0.4  Alkaline Phos - - 33(L)  AST - - 23  ALT - - 27   CBC Latest Ref Rng & Units 07/16/2019 12/25/2007 12/24/2007  WBC 4.0 - 10.5 K/uL 6.0 10.3 8.1  Hemoglobin 12.0 - 15.0 g/dL 14.9 12.1 12.5  Hematocrit 36 - 46 % 44.3 35.0(L) 36.5  Platelets 150 - 400 K/uL 306 280 251   Lipid Panel     Component Value Date/Time   CHOL 234 (H) 10/02/2019 0939   TRIG 369 (H) 10/02/2019 0939   HDL 41 10/02/2019 0939   CHOLHDL 5.7 (H) 10/02/2019 0939   LDLCALC 127 (H) 10/02/2019 0939   HEMOGLOBIN A1C No results found for: HGBA1C, MPG TSH Recent Labs     07/16/19 1307  TSH 0.938   External labs:  Labs 08/26/2019: Serum glucose 88 mg, BUN 10, creatinine 1.11, EGFR 60 mL, potassium 4.8.   PRN Meds:. Medications Discontinued During This Encounter  Medication Reason  . pregabalin (LYRICA) 75 MG capsule Patient has not taken in last 30 days  . Riboflavin (VITAMIN B-2 PO) Patient Preference   Current Meds  Medication Sig  . acebutolol (SECTRAL) 200 MG capsule TAKE 1 CAPSULE BY MOUTH TWICE A DAY (Patient taking differently: daily. )  . buPROPion (WELLBUTRIN XL) 300 MG 24 hr tablet Take 300 mg by mouth daily.  . cyclobenzaprine (FLEXERIL) 10 MG tablet daily.  Marland Kitchen desvenlafaxine (PRISTIQ) 100 MG 24 hr tablet Take 100 mg by mouth daily.   Marland Kitchen docusate sodium (COLACE) 100 MG capsule Take 100 mg by mouth daily.   Marland Kitchen esomeprazole (NEXIUM) 20 MG capsule Take 20 mg by mouth daily at 12 noon.  Marland Kitchen estradiol (CLIMARA - DOSED IN MG/24 HR) 0.075 mg/24hr patch Place 0.075 mg onto the skin once a week.  Marland Kitchen Ketotifen Fumarate (ITCHY EYE DROPS OP) Apply to eye. PRN  . MAGNESIUM PO Take 500 mg by mouth daily.   . memantine (NAMENDA) 10 MG tablet Take 10 mg by mouth 2 (two) times daily.  . pregabalin (LYRICA) 225 MG capsule Take 225 mg by mouth 2 (two) times daily.  . rizatriptan (MAXALT) 10 MG tablet Take 10 mg by mouth as needed for migraine. May repeat in 2 hours if needed  . simvastatin (ZOCOR) 40 MG tablet Take 1 tablet (40 mg total) by mouth at bedtime.  . traZODone (DESYREL) 50 MG tablet Take 50 mg by mouth at bedtime.   . vitamin B-12 (CYANOCOBALAMIN) 500 MCG tablet Take 500 mcg by mouth daily.  . Vitamin D, Ergocalciferol, (DRISDOL) 1.25 MG (50000 UT) CAPS capsule Take 50,000 Units by mouth every 7 (seven) days.  Marland Kitchen zonisamide (ZONEGRAN) 100 MG capsule Take 500 mg by mouth daily.     Cardiac Studies:   Echocardiogram 04/16/2019: Left ventricle cavity is normal in size. Mild concentric hypertrophy of the left ventricle. Normal LV systolic function  with EF 55%. Normal global wall motion. Doppler evidence of grade I (impaired) diastolic dysfunction, normal LAP.  Trileaflet aortic valve. Moderate (Grade III) aortic regurgitation. Mechanism of regurgitation is unclear, bases on limited visualization of the aortic valve. No other significant valvular abnormality. Normal right atria pressure.   Lexiscan (Walking with Jaci Carrel)  Sestamibi Stress Test 09/09/2019: Nondiagnostic ECG stress. Normal myocardial perfusion. All segments of left ventricle demonstrated normal wall motion and thickening. Stress LV EF is normal 69%.  No previous exam available for comparison. Low risk study.   EKG:  EKG 01/30/2020: Normal sinus rhythm with rate 74 bpm, left atrial enlargement, normal axis.  No evidence of ischemia. Normal EKG otherwise.  No significant change from 09/03/19.   Assessment:     ICD-10-CM   1. Mixed hyperlipidemia  E78.2 EKG 12-Lead    Lipid Panel With LDL/HDL Ratio    LDL cholesterol, direct    LDL cholesterol, direct    Lipid Panel With LDL/HDL Ratio  2. Inappropriate sinus tachycardia  R00.0   3. Dyspnea on exertion  R06.00   4. Malaise and fatigue  R53.81    R53.83   5. Moderate aortic regurgitation  I35.1     Recommendations:   Veronica Gates  is a 44 y.o. female  with history of chronic migranes, hypertension not on therapy, hyperlipidemia, family history of early CAD in older sister (CABG in her 38's) and her father, former tobacco use, followed by [place] for inappropriate sinus tachycardia. Since being on Acebutolol, heart rate has improved; however, continues to have fatigue and dyspnea on exertion.   She has now been diagnosed with fibromyalgia.  She is trying to get disability from her medical condition.  From cardiac standpoint symptoms improved, she does have moderate aortic regurgitation that needs a follow-up, no clinical evidence of heart failure, she does have moderate aortic regurgitation but clinically not  significant presently.  We will continue to monitor this, I may consider repeating echocardiogram in 6 months to year.  With regard to hyperlipidemia, she is tolerating simvastatin, will obtain a lipid profile testing.  I reviewed her external labs, lipids have not been performed.  Her marked fatigue and dyspnea may be related to fibromyalgia but may need to exclude central sleep apnea as well.  She wishes to discuss this with her neurologist that she has established relationship due to her chronic migraine headaches.  Otherwise remained stable from cardiac standpoint, her physical ability has decreased remarkably over the past few months compared to even a year ago.  In fact she was sleeping in the examination room when I entered. She has significantly deteriorated with regards to her physical ability to doing things including struggling with her ADL.    Adrian Prows, MD, [facility] 02/01/2020, 5:35 PM Office: [phone]

## 2020-02-26 ENCOUNTER — Other Ambulatory Visit: Payer: Self-pay | Admitting: Cardiology

## 2020-03-04 ENCOUNTER — Other Ambulatory Visit: Payer: Self-pay | Admitting: Cardiology

## 2020-04-10 ENCOUNTER — Ambulatory Visit: Payer: Managed Care, Other (non HMO) | Admitting: Cardiology

## 2020-06-20 ENCOUNTER — Other Ambulatory Visit: Payer: Self-pay | Admitting: Cardiology

## 2020-07-31 ENCOUNTER — Ambulatory Visit: Payer: 59 | Admitting: Cardiology

## 2021-01-20 ENCOUNTER — Other Ambulatory Visit: Payer: Self-pay | Admitting: Cardiology

## 2021-02-02 ENCOUNTER — Other Ambulatory Visit: Payer: Self-pay | Admitting: Obstetrics & Gynecology

## 2021-02-18 DIAGNOSIS — E876 Hypokalemia: Secondary | ICD-10-CM

## 2021-02-18 HISTORY — DX: Hypokalemia: E87.6

## 2021-03-04 ENCOUNTER — Encounter (HOSPITAL_BASED_OUTPATIENT_CLINIC_OR_DEPARTMENT_OTHER): Payer: Self-pay | Admitting: Obstetrics & Gynecology

## 2021-03-08 ENCOUNTER — Encounter (HOSPITAL_COMMUNITY)
Admission: RE | Admit: 2021-03-08 | Discharge: 2021-03-08 | Disposition: A | Discharge status: Home / Self Care | Payer: 59 | Source: Ambulatory Visit | Attending: Anesthesiology | Admitting: Anesthesiology

## 2021-03-11 ENCOUNTER — Ambulatory Visit (HOSPITAL_BASED_OUTPATIENT_CLINIC_OR_DEPARTMENT_OTHER): Admission: RE | Admit: 2021-03-11 | Payer: 59 | Source: Home / Self Care | Admitting: Obstetrics & Gynecology

## 2021-03-11 HISTORY — DX: Major depressive disorder, single episode, unspecified: F32.9

## 2021-03-11 HISTORY — DX: Tachycardia, unspecified: R00.0

## 2021-03-11 HISTORY — DX: Restless legs syndrome: G25.81

## 2021-03-11 HISTORY — DX: Personal history of other mental and behavioral disorders: Z86.59

## 2021-03-11 HISTORY — DX: Gastro-esophageal reflux disease without esophagitis: K21.9

## 2021-03-11 HISTORY — DX: Inappropriate sinus tachycardia, so stated: I47.11

## 2021-03-11 HISTORY — DX: Periodic limb movement disorder: G47.61

## 2021-03-11 HISTORY — DX: Unspecified dyspareunia: N94.10

## 2021-03-11 HISTORY — DX: Nonrheumatic aortic (valve) insufficiency: I35.1

## 2021-03-11 HISTORY — DX: Generalized anxiety disorder: F41.1

## 2021-03-11 HISTORY — DX: Personal history of COVID-19: Z86.16

## 2021-03-11 HISTORY — DX: Mixed hyperlipidemia: E78.2

## 2021-03-11 SURGERY — LAPAROSCOPY OPERATIVE
Anesthesia: Choice

## 2021-03-27 ENCOUNTER — Other Ambulatory Visit: Payer: Self-pay | Admitting: Cardiology

## 2021-03-30 NOTE — Progress Notes (Signed)
Synopsis: Referred for chronic cough by Eartha Inch, MD  Subjective:   PATIENT ID: Veronica Gates GENDER: female DOB: Dec 11, 1975, MRN: [id number]  Chief Complaint  Patient presents with   Consult    Chronic cough, had Covid June 28th. Dry cough at night. Sob.       44yF with history of chronic cough previously followed by Dr. Sherene Sires, seasonal rhinitis, GERD, migraines, fibromyalgia, smoking, moderate aortic regurgitation, inappropriate sinus tachycardia followed by cardiology, covid-19 infection  Saw PCP 03/16/21 and discussed persistent cough following covid-19 infection. She says that she's always had some trouble with coughing that would come and go - typically seasonal along with rhinitis. She says she's also always had some minor DOE. Cough and DOE were worse after covid-19. She says her lungs felt like they were on fire after her infection for a couple weeks. Still has right anterior chest pain with coughing. She did take paxlovid. She was vaccinated for covid. Cough is now worst at night or with conversation. Dry. She has no associated throat tightness. DOE to 1 block perhaps although she concedes that it's actually profound fatigue that limits her rather than dyspnea. She has no postnasal drip sensation, sinonasal congestion currently. She has no orthopnea, BLE swelling. She has had some weight gain but attributes it to medicines.   She takes her ppi nightly after dinner.   No family history of lung disease.   She was in [place] previously. Has a dog at home, no bird. Daughter does have a cat at home with her. Has always lived in [place] since she moved early after birth in [place].     Otherwise pertinent review of systems is negative.  Past Medical History:  Diagnosis Date   Dyspareunia, female    Fibromyalgia 09/2019   GAD (generalized anxiety disorder)    GERD (gastroesophageal reflux disease)    History of 2019 novel coronavirus disease (COVID-19)    per pt positive home  test 02-08-2021 and provider note 02-09-2021 in care everywhere;  mild symptoms   History of bulimia    Hypertension    followed by pcp and cardiology   (nuclear stress test 09-09-2019 , low risk with no ischemia, normal LVF and wall motion, nuclear ef 69%)   Hypokalemia 02/18/2021   per pcp lab results 02-18-2021 in care everywhere   Inappropriate sinus tachycardia    Insomnia 2009   MDD (major depressive disorder)    Migraines    neurologist-- dr Shela Commons. Hyacinth Meeker ([facility] in [place])  chronic migraines with and without aura   Mixed hyperlipidemia    Nonrheumatic aortic (valve) insufficiency    followed by cardiology, dr m. badger---  04-16-2019 echo moderate AR without stenosis;  10-29-2020 echo , mild AR without stenosis   Periodic limb movement disorder    RLS (restless legs syndrome)    Vitamin D deficiency 2008     Family History  Problem Relation Age of Onset   Diabetes Sister    Hypertension Sister    Hyperlipidemia Sister    Heart disease Sister    Thrombocytopenia Sister    Thrombocytopenia Brother    Gout Brother    Hypertension Brother    Hyperlipidemia Brother    Diabetes type II Father    Hypertension Father    Heart disease Father    Thrombocytopenia Father    Gout Father    Throat cancer Mother        hpv   Depression Mother    Anxiety  disorder Mother    Anemia Mother    Allergies Mother    Asthma Son      Past Surgical History:  Procedure Laterality Date   CESAREAN SECTION     1995;  2000;   01-25-2001 @ [facility] ,  W/  BILATERAL TUBAL LIGATION   LAPAROSCOPIC OVARIAN CYSTECTOMY  12/24/2007    Social History   Socioeconomic History   Marital status: Married    Spouse name: Not on file   Number of children: 3   Years of education: `   Highest education level: Not on file  Occupational History   Not on file  Tobacco Use   Smoking status: Former    Packs/day: 0.75    Years: 10.00    Pack years: 7.50    Types: Cigarettes    Quit date: 08/16/1999    Years  since quitting: 21.6   Smokeless tobacco: Never  Vaping Use   Vaping Use: Never used  Substance and Sexual Activity   Alcohol use: No    Alcohol/week: 0.0 standard drinks   Drug use: Not Currently    Types: Methamphetamines   Sexual activity: Yes    Birth control/protection: Surgical  Other Topics Concern   Not on file  Social History Narrative   Not on file   Social Determinants of Health   Financial Resource Strain: Not on file  Food Insecurity: Not on file  Transportation Needs: Not on file  Physical Activity: Not on file  Stress: Not on file  Social Connections: Not on file  Intimate Partner Violence: Not on file     Allergies  Allergen Reactions   Floxin [Ofloxacin] Anaphylaxis   Sumatriptan Other (See Comments)    "body feels like it's on fire"     Outpatient Medications Prior to Visit  Medication Sig Dispense Refill   acebutolol (SECTRAL) 200 MG capsule TAKE 1 CAPSULE BY MOUTH TWICE A DAY 180 capsule 1   Biotin 73428 MCG TABS Take by mouth.     buPROPion (WELLBUTRIN XL) 300 MG 24 hr tablet Take 300 mg by mouth daily.     desvenlafaxine (PRISTIQ) 100 MG 24 hr tablet Take 100 mg by mouth daily.      docusate sodium (COLACE) 100 MG capsule Take 100 mg by mouth daily.      Elagolix Sodium (ORILISSA) 200 MG TABS Take 200 mg by mouth.     esomeprazole (NEXIUM) 20 MG capsule Take 20 mg by mouth daily at 12 noon.     gabapentin (NEURONTIN) 300 MG capsule Take 300 mg by mouth 3 (three) times daily.     Ketotifen Fumarate (ITCHY EYE DROPS OP) Apply to eye. PRN     MAGNESIUM PO Take 500 mg by mouth daily.      memantine (NAMENDA) 10 MG tablet Take 10 mg by mouth 2 (two) times daily.     Rimegepant Sulfate (NURTEC) 75 MG TBDP Take by mouth.     rizatriptan (MAXALT) 10 MG tablet Take 10 mg by mouth as needed for migraine. May repeat in 2 hours if needed     simvastatin (ZOCOR) 40 MG tablet TAKE 1 TABLET BY MOUTH EVERYDAY AT BEDTIME 30 tablet 11   tiZANidine (ZANAFLEX) 4 MG  tablet Take 4 mg by mouth every 6 (six) hours as needed for muscle spasms.     traZODone (DESYREL) 50 MG tablet Take 50 mg by mouth at bedtime.      vitamin B-12 (CYANOCOBALAMIN) 500 MCG tablet Take 500  mcg by mouth daily.     Vitamin D, Ergocalciferol, (DRISDOL) 1.25 MG (50000 UT) CAPS capsule Take 50,000 Units by mouth every 7 (seven) days.     zonisamide (ZONEGRAN) 100 MG capsule Take 500 mg by mouth daily.      pregabalin (LYRICA) 225 MG capsule Take 225 mg by mouth 2 (two) times daily.     cyclobenzaprine (FLEXERIL) 10 MG tablet daily.     estradiol (CLIMARA - DOSED IN MG/24 HR) 0.075 mg/24hr patch Place 0.075 mg onto the skin once a week.     No facility-administered medications prior to visit.       Objective:   Physical Exam:  General appearance: 45 y.o., female, NAD, conversant  Eyes: anicteric sclerae, moist conjunctivae; no lid-lag; PERRL, tracking appropriately HENT: NCAT; oropharynx, MMM, no mucosal ulcerations; normal hard and soft palate Neck: Trachea midline; no lymphadenopathy, no JVD Lungs: CTAB, no crackles, no wheeze, with normal respiratory effort CV: RRR, no MRGs  Abdomen: Soft, non-tender; non-distended, BS present  Extremities: No peripheral edema, radial and DP pulses present bilaterally  Skin: Normal temperature, turgor and texture; no rash Psych: Appropriate affect Neuro: Alert and oriented to person and place, no focal deficit    Vitals:   03/31/21 0913  BP: 104/80  Pulse: 76  Temp: 97.9 F (36.6 C)  TempSrc: Oral  SpO2: 97%  Weight: 175 lb 3.2 oz (79.5 kg)   97% on RA BMI Readings from Last 3 Encounters:  03/31/21 25.87 kg/m  01/30/20 24.13 kg/m  10/07/19 21.62 kg/m   Wt Readings from Last 3 Encounters:  03/31/21 175 lb 3.2 oz (79.5 kg)  01/30/20 163 lb 6.4 oz (74.1 kg)  10/07/19 146 lb 6.4 oz (66.4 kg)     CBC    Component Value Date/Time   WBC 6.0 07/16/2019 1206   RBC 4.87 07/16/2019 1206   HGB 14.9 07/16/2019 1206   HCT  44.3 07/16/2019 1206   PLT 306 07/16/2019 1206   MCV 91.0 07/16/2019 1206   MCH 30.6 07/16/2019 1206   MCHC 33.6 07/16/2019 1206   RDW 11.9 07/16/2019 1206   LYMPHSABS 2.1 12/05/2007 2312   MONOABS 0.6 12/05/2007 2312   EOSABS 0.1 12/05/2007 2312   BASOSABS 0.0 12/05/2007 2312    Low level eosinophilia historically  Chest Imaging: CXR 09/2009 reviewed by me and unremarkable  CT A/P 2009 lung bases reviewed by me and unremarkable but captured very little of parenchyma  Pulmonary Functions Testing Results: No flowsheet data found.    Echocardiogram:  TTE 9/20 with moderate AR, G1DD      Assessment & Plan:   # Subacute on chronic cough: Subacute component may reflect residual viral reactive airways. GERD also likely major factor.   # Dyspnea on exertion vs fatigue: Seems like fatigue and daytime sleepiness are more significant issues for her than true DOE. Snores per husband. Had sleep study in distant past when she weighed a bit less per her report significant only for PLMS. Does also have moderate AR, inappropriate sinus tachycardia which could be contributory to DOE although prior stress testing suggestive of component of deconditioning.   # GERD: taking omeprazole after dinner nightly. Still has some breakthrough reflux symptoms with dietary indiscretion.  # Seasonal rhinitis: under good control  Plan: - PFTs in a month to allow a little more time to recover from covid-19 infection - CXR PA/lateral in a month - start taking omeprazole 20 mg every morning 30 minutes before eating - in-lab  PSG - can be arranged through our clinic or neurology, Ms.Kennon RoundsHaney will let us know if she wants us to order it     Omar PersonNathaniel M Nicolis Boody, MD [facility] 03/31/2021 9:25 AM

## 2021-03-31 ENCOUNTER — Encounter: Payer: Self-pay | Admitting: Student

## 2021-03-31 ENCOUNTER — Ambulatory Visit: Payer: 59

## 2021-03-31 ENCOUNTER — Ambulatory Visit: Payer: 59 | Admitting: Student

## 2021-03-31 ENCOUNTER — Other Ambulatory Visit: Payer: Self-pay

## 2021-03-31 VITALS — BP 104/80 | HR 76 | Temp 97.9°F | Ht 69.0 in | Wt 175.2 lb

## 2021-03-31 DIAGNOSIS — U071 COVID-19: Secondary | ICD-10-CM | POA: Diagnosis not present

## 2021-03-31 DIAGNOSIS — K219 Gastro-esophageal reflux disease without esophagitis: Secondary | ICD-10-CM | POA: Diagnosis not present

## 2021-03-31 DIAGNOSIS — R053 Chronic cough: Secondary | ICD-10-CM | POA: Diagnosis not present

## 2021-03-31 DIAGNOSIS — J302 Other seasonal allergic rhinitis: Secondary | ICD-10-CM | POA: Diagnosis not present

## 2021-03-31 NOTE — Patient Instructions (Signed)
-   START taking your omeprazole every day 1st thing in the morning 30 minutes before you eat or take other medicines. Let me know in 4 weeks if it's not helping - Breathing tests (pulmonary function tests- PFTs), chest x-ray in a month - Discuss sleep study with your neurologist. I think you may benefit from an in-lab sleep study. I'm happy to arrange for this if you don't pursue it with neurology, just send me a message. - Can add dextromethorphan over the counter at night for cough suppression - See you in 3 months!

## 2021-04-20 ENCOUNTER — Other Ambulatory Visit: Payer: Self-pay | Admitting: Cardiology

## 2021-05-03 ENCOUNTER — Other Ambulatory Visit: Payer: Self-pay

## 2021-05-03 ENCOUNTER — Ambulatory Visit (INDEPENDENT_AMBULATORY_CARE_PROVIDER_SITE_OTHER): Payer: 59 | Admitting: Student

## 2021-05-03 DIAGNOSIS — R053 Chronic cough: Secondary | ICD-10-CM | POA: Diagnosis not present

## 2021-05-03 LAB — PULMONARY FUNCTION TEST
DL/VA % pred: 78 %
DL/VA: 3.29 ml/min/mmHg/L
DLCO cor % pred: 95 %
DLCO cor: 24.17 ml/min/mmHg
DLCO unc % pred: 95 %
DLCO unc: 24.17 ml/min/mmHg
FEF 25-75 Post: 3.51 L/sec
FEF 25-75 Pre: 3.39 L/sec
FEF2575-%Change-Post: 3 %
FEF2575-%Pred-Post: 109 %
FEF2575-%Pred-Pre: 105 %
FEV1-%Change-Post: 0 %
FEV1-%Pred-Post: 116 %
FEV1-%Pred-Pre: 115 %
FEV1-Post: 3.95 L
FEV1-Pre: 3.91 L
FEV1FVC-%Change-Post: 2 %
FEV1FVC-%Pred-Pre: 94 %
FEV6-%Change-Post: -1 %
FEV6-%Pred-Post: 121 %
FEV6-%Pred-Pre: 123 %
FEV6-Post: 5.04 L
FEV6-Pre: 5.12 L
FEV6FVC-%Pred-Post: 102 %
FEV6FVC-%Pred-Pre: 102 %
FVC-%Change-Post: -1 %
FVC-%Pred-Post: 119 %
FVC-%Pred-Pre: 120 %
FVC-Post: 5.06 L
FVC-Pre: 5.12 L
Post FEV1/FVC ratio: 78 %
Post FEV6/FVC ratio: 100 %
Pre FEV1/FVC ratio: 76 %
Pre FEV6/FVC Ratio: 100 %
RV % pred: 123 %
RV: 2.41 L
TLC % pred: 134 %
TLC: 7.8 L

## 2021-05-03 NOTE — Patient Instructions (Signed)
Full PFT performed today. °

## 2021-05-03 NOTE — Progress Notes (Signed)
Full PFT performed today. °

## 2021-05-26 ENCOUNTER — Ambulatory Visit (INDEPENDENT_AMBULATORY_CARE_PROVIDER_SITE_OTHER): Payer: 59

## 2021-05-26 DIAGNOSIS — R053 Chronic cough: Secondary | ICD-10-CM

## 2021-06-02 ENCOUNTER — Other Ambulatory Visit: Payer: Self-pay

## 2021-06-02 ENCOUNTER — Ambulatory Visit: Payer: 59 | Admitting: Student

## 2021-06-02 ENCOUNTER — Encounter: Payer: Self-pay | Admitting: Student

## 2021-06-02 VITALS — BP 102/64 | HR 74 | Temp 98.2°F | Ht 69.0 in | Wt 166.4 lb

## 2021-06-02 DIAGNOSIS — R5383 Other fatigue: Secondary | ICD-10-CM | POA: Diagnosis not present

## 2021-06-02 DIAGNOSIS — R053 Chronic cough: Secondary | ICD-10-CM | POA: Diagnosis not present

## 2021-06-02 NOTE — Patient Instructions (Addendum)
-   if cough worsens/persistent and is most bothersome issue, can try increasing omeprazole to 20 mg before breakfast, 20 mg 30 min before dinner. Could also obtain methacholine challenge to rule out asthma - if shortness of breath major issue then can pursue CPET (exercise testing) to help [place] see if cardiac, pulmonary, neuromuscular, or other issue underlying it

## 2021-06-02 NOTE — Progress Notes (Signed)
Synopsis: Referred for chronic cough by Eartha Inch, MD  Subjective:   PATIENT ID: Veronica Gates GENDER: female DOB: 1975-11-11, MRN: [id number]  No chief complaint on file.  44yF with history of chronic cough previously followed by Dr. Sherene Sires, seasonal rhinitis, GERD, migraines, fibromyalgia, smoking, moderate aortic regurgitation, inappropriate sinus tachycardia followed by cardiology, covid-19 infection referred for cough, DOE  Interval HPI: Has had some GI issues since last visit with diarrhea - has lost 12-13 lb in last week, has GI appointment coming up in November. Cough is improved a bit. Still feels some dyspnea with exertion that is relatively mild and less significant than the general fatigue she's had since covid infection. No postnasal drainage.   Taking omeprazole 20 mg each morning 30 min before breakfast.   Otherwise pertinent review of systems is negative.  Past Medical History:  Diagnosis Date   Dyspareunia, female    Fibromyalgia 09/2019   GAD (generalized anxiety disorder)    GERD (gastroesophageal reflux disease)    History of 2019 novel coronavirus disease (COVID-19)    per pt positive home test 02-08-2021 and provider note 02-09-2021 in care everywhere;  mild symptoms   History of bulimia    Hypertension    followed by pcp and cardiology   (nuclear stress test 09-09-2019 , low risk with no ischemia, normal LVF and wall motion, nuclear ef 69%)   Hypokalemia 02/18/2021   per pcp lab results 02-18-2021 in care everywhere   Inappropriate sinus tachycardia    Insomnia 2009   MDD (major depressive disorder)    Migraines    neurologist-- dr Shela Commons. Hyacinth Meeker ([facility] in [place])  chronic migraines with and without aura   Mixed hyperlipidemia    Nonrheumatic aortic (valve) insufficiency    followed by cardiology, dr m. badger---  04-16-2019 echo moderate AR without stenosis;  10-29-2020 echo , mild AR without stenosis   Periodic limb movement disorder    RLS  (restless legs syndrome)    Vitamin D deficiency 2008     Family History  Problem Relation Age of Onset   Diabetes Sister    Hypertension Sister    Hyperlipidemia Sister    Heart disease Sister    Thrombocytopenia Sister    Thrombocytopenia Brother    Gout Brother    Hypertension Brother    Hyperlipidemia Brother    Diabetes type II Father    Hypertension Father    Heart disease Father    Thrombocytopenia Father    Gout Father    Throat cancer Mother        hpv   Depression Mother    Anxiety disorder Mother    Anemia Mother    Allergies Mother    Asthma Son      Past Surgical History:  Procedure Laterality Date   CESAREAN SECTION     1995;  2000;   01-25-2001 @ [facility] ,  W/  BILATERAL TUBAL LIGATION   LAPAROSCOPIC OVARIAN CYSTECTOMY  12/24/2007    Social History   Socioeconomic History   Marital status: Married    Spouse name: Not on file   Number of children: 3   Years of education: `   Highest education level: Not on file  Occupational History   Not on file  Tobacco Use   Smoking status: Former    Packs/day: 0.75    Years: 10.00    Pack years: 7.50    Types: Cigarettes    Quit date: 08/16/1999  Years since quitting: 21.8   Smokeless tobacco: Never  Vaping Use   Vaping Use: Never used  Substance and Sexual Activity   Alcohol use: No    Alcohol/week: 0.0 standard drinks   Drug use: Not Currently    Types: Methamphetamines   Sexual activity: Yes    Birth control/protection: Surgical  Other Topics Concern   Not on file  Social History Narrative   Not on file   Social Determinants of Health   Financial Resource Strain: Not on file  Food Insecurity: Not on file  Transportation Needs: Not on file  Physical Activity: Not on file  Stress: Not on file  Social Connections: Not on file  Intimate Partner Violence: Not on file     Allergies  Allergen Reactions   Floxin [Ofloxacin] Anaphylaxis   Sumatriptan Other (See Comments)    "body feels like  it's on fire"     Outpatient Medications Prior to Visit  Medication Sig Dispense Refill   acebutolol (SECTRAL) 200 MG capsule TAKE 1 CAPSULE BY MOUTH TWICE A DAY 180 capsule 1   Biotin 77824 MCG TABS Take by mouth.     buPROPion (WELLBUTRIN XL) 300 MG 24 hr tablet Take 300 mg by mouth daily.     desvenlafaxine (PRISTIQ) 100 MG 24 hr tablet Take 100 mg by mouth daily.      docusate sodium (COLACE) 100 MG capsule Take 100 mg by mouth daily.      Elagolix Sodium (ORILISSA) 200 MG TABS Take 200 mg by mouth.     esomeprazole (NEXIUM) 20 MG capsule Take 20 mg by mouth daily at 12 noon.     gabapentin (NEURONTIN) 300 MG capsule Take 300 mg by mouth 3 (three) times daily.     Ketotifen Fumarate (ITCHY EYE DROPS OP) Apply to eye. PRN     MAGNESIUM PO Take 500 mg by mouth daily.      memantine (NAMENDA) 10 MG tablet Take 10 mg by mouth 2 (two) times daily.     Rimegepant Sulfate (NURTEC) 75 MG TBDP Take by mouth.     rizatriptan (MAXALT) 10 MG tablet Take 10 mg by mouth as needed for migraine. May repeat in 2 hours if needed     simvastatin (ZOCOR) 40 MG tablet TAKE 1 TABLET BY MOUTH EVERYDAY AT BEDTIME 90 tablet 3   tiZANidine (ZANAFLEX) 4 MG tablet Take 4 mg by mouth every 6 (six) hours as needed for muscle spasms.     traZODone (DESYREL) 50 MG tablet Take 50 mg by mouth at bedtime.      vitamin B-12 (CYANOCOBALAMIN) 500 MCG tablet Take 500 mcg by mouth daily.     Vitamin D, Ergocalciferol, (DRISDOL) 1.25 MG (50000 UT) CAPS capsule Take 50,000 Units by mouth every 7 (seven) days.     zonisamide (ZONEGRAN) 100 MG capsule Take 500 mg by mouth daily.      No facility-administered medications prior to visit.       Objective:   Physical Exam:  General appearance: 45 y.o., female, NAD, conversant  Eyes: anicteric sclerae, moist conjunctivae; no lid-lag; PERRL, tracking appropriately HENT: NCAT; oropharynx, MMM, no mucosal ulcerations; normal hard and soft palate Neck: Trachea midline; no  lymphadenopathy, no JVD Lungs: CTAB, no crackles, no wheeze, with normal respiratory effort CV: RRR, no MRGs  Abdomen: Soft, non-tender; non-distended, BS present  Extremities: No peripheral edema, radial and DP pulses present bilaterally  Skin: Normal temperature, turgor and texture; no rash Psych: Appropriate affect Neuro:  Alert and oriented to Gates and place, no focal deficit    There were no vitals filed for this visit.    on RA BMI Readings from Last 3 Encounters:  03/31/21 25.87 kg/m  01/30/20 24.13 kg/m  10/07/19 21.62 kg/m   Wt Readings from Last 3 Encounters:  03/31/21 175 lb 3.2 oz (79.5 kg)  01/30/20 163 lb 6.4 oz (74.1 kg)  10/07/19 146 lb 6.4 oz (66.4 kg)     CBC    Component Value Date/Time   WBC 6.0 07/16/2019 1206   RBC 4.87 07/16/2019 1206   HGB 14.9 07/16/2019 1206   HCT 44.3 07/16/2019 1206   PLT 306 07/16/2019 1206   MCV 91.0 07/16/2019 1206   MCH 30.6 07/16/2019 1206   MCHC 33.6 07/16/2019 1206   RDW 11.9 07/16/2019 1206   LYMPHSABS 2.1 12/05/2007 2312   MONOABS 0.6 12/05/2007 2312   EOSABS 0.1 12/05/2007 2312   BASOSABS 0.0 12/05/2007 2312   Normal TSH 09/2020 CBC/diff 05/12/21 WNL   Low level eosinophilia historically  Chest Imaging: CXR 09/2009 reviewed by me and unremarkable  CT A/P 2009 lung bases reviewed by me and unremarkable but captured very little of parenchyma  CXR 05/26/21 reviewed by me and unremarkable  Pulmonary Functions Testing Results: PFT Results Latest Ref Rng & Units 05/03/2021  FVC-Pre L 5.12  FVC-Predicted Pre % 120  FVC-Post L 5.06  FVC-Predicted Post % 119  Pre FEV1/FVC % % 76  Post FEV1/FCV % % 78  FEV1-Pre L 3.91  FEV1-Predicted Pre % 115  FEV1-Post L 3.95  DLCO uncorrected ml/min/mmHg 24.17  DLCO UNC% % 95  DLCO corrected ml/min/mmHg 24.17  DLCO COR %Predicted % 95  DLVA Predicted % 78  TLC L 7.80  TLC % Predicted % 134  RV % Predicted % 123      Echocardiogram:  TTE 9/20 with moderate  AR, G1DD      Assessment & Plan:   # Chronic cough: Improvement may reflect resolution of component of post-viral cough vs change in timing of omeprazole to morning use before breakfast.   # Dyspnea on exertion vs fatigue: Seems like fatigue and daytime sleepiness remain more significant issues for her than true DOE. Snores per husband. Had sleep study in distant past when she weighed a bit less per her report significant only for PLMS. Does also have moderate AR, inappropriate sinus tachycardia which could be contributory to DOE although prior stress testing suggestive of component of deconditioning.   Plan: - continue omeprazole 20 mg every morning 30 minutes before eating. Can consider increasing dose at next visit and/or pursuing methacholine challenge depending on how she's feeling - if instead DOE more prominent at next visit could consider CPET to guide further workup given history of diastolic dysfunction and moderate AR with relatively normal PFTs and normal CXR.  - in-lab PSG - can be arranged through our clinic or neurology, Ms.Fraga will let us know if she wants [place] to order it at next visit     Veronica Person, MD [facility] 06/02/2021 6:41 AM

## 2021-06-24 ENCOUNTER — Other Ambulatory Visit: Payer: Self-pay | Admitting: Gastroenterology

## 2021-06-24 DIAGNOSIS — R1310 Dysphagia, unspecified: Secondary | ICD-10-CM

## 2021-06-29 ENCOUNTER — Ambulatory Visit
Admission: RE | Admit: 2021-06-29 | Discharge: 2021-06-29 | Disposition: A | Discharge status: Home / Self Care | Payer: 59 | Source: Ambulatory Visit | Attending: Gastroenterology | Admitting: Gastroenterology

## 2021-06-29 DIAGNOSIS — R1310 Dysphagia, unspecified: Secondary | ICD-10-CM

## 2021-06-30 ENCOUNTER — Other Ambulatory Visit: Payer: 59

## 2021-07-28 ENCOUNTER — Ambulatory Visit: Payer: 59 | Admitting: Student

## 2023-02-10 ENCOUNTER — Other Ambulatory Visit: Payer: Self-pay

## 2023-02-10 ENCOUNTER — Ambulatory Visit
Admission: RE | Admit: 2023-02-10 | Discharge: 2023-02-10 | Disposition: A | Discharge status: Home / Self Care | Payer: Medicare HMO | Source: Ambulatory Visit | Attending: Physician Assistant | Admitting: Physician Assistant

## 2023-02-10 DIAGNOSIS — R0989 Other specified symptoms and signs involving the circulatory and respiratory systems: Secondary | ICD-10-CM

## 2023-03-13 ENCOUNTER — Other Ambulatory Visit: Payer: Self-pay | Admitting: Nephrology

## 2023-03-13 DIAGNOSIS — N1831 Chronic kidney disease, stage 3a: Secondary | ICD-10-CM

## 2023-03-29 ENCOUNTER — Ambulatory Visit
Admission: RE | Admit: 2023-03-29 | Discharge: 2023-03-29 | Disposition: A | Discharge status: Home / Self Care | Payer: Medicare HMO | Source: Ambulatory Visit | Attending: Nephrology | Admitting: Nephrology

## 2023-03-29 DIAGNOSIS — N1831 Chronic kidney disease, stage 3a: Secondary | ICD-10-CM

## 2023-06-27 IMAGING — RF DG ESOPHAGUS
9 of 10 series · 14 of 24 positions shown · non-contrast
Comparison: NONE.

CLINICAL DATA: Globus sensation.  Dysphagia.

EXAM:
ESOPHAGUS/BARIUM SWALLOW/TABLET STUDY
TECHNIQUE: Combined double and single contrast examination was performed using
effervescent crystals, high-density barium, and thin liquid barium.
This exam was performed by Joes Chox PA, and was supervised and
interpreted by Jaguin Mataoulo Bithovard. Dreiling Eduard.
FLUOROSCOPY TIME:  Radiation Exposure Index (as provided by the
fluoroscopic device): 13.7 mGy
If the device does not provide the exposure index:
Fluoroscopy Time:  3 minute 0 seconds
Number of Acquired Images:  1

[Series 1: sequence · 2 of 20 frames shown (1 of 8)]
[frame 4/20]
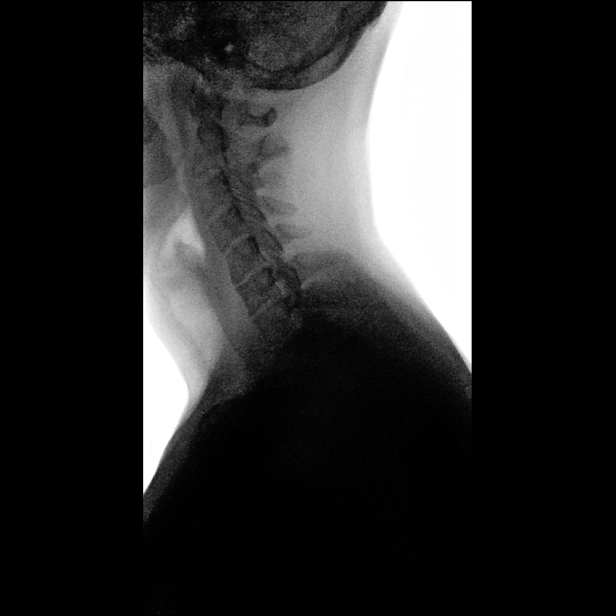
[frame 18/20]
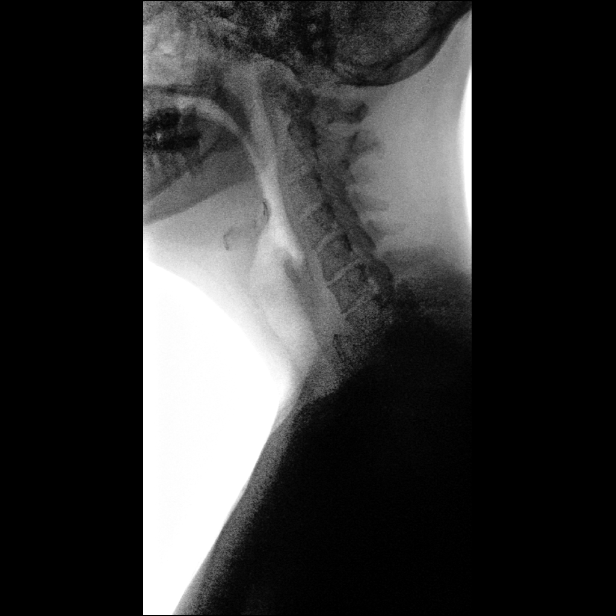

[Series 2: sequence · 1 of 19 frames shown (2 of 8)]
[frame 14/19]
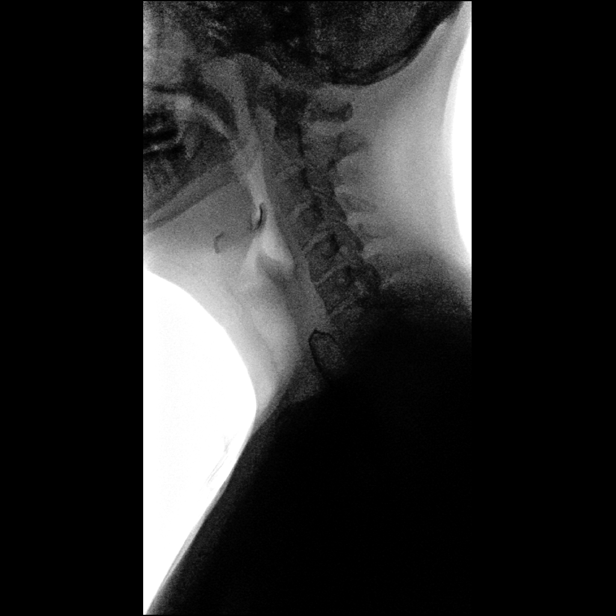

[Series 3: sequence · 2 of 23 frames shown (3 of 8)]
[frame 12/23]
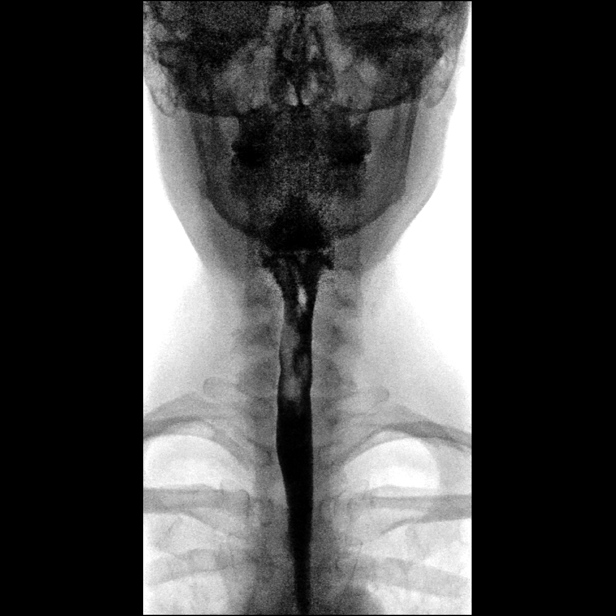
[frame 20/23]
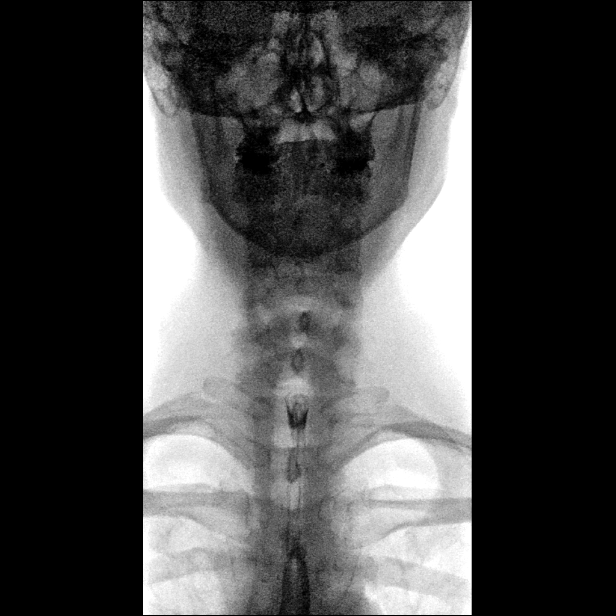

[Series 5: sequence · 1 of 39 frames shown (4 of 8)]
[frame 19/39]
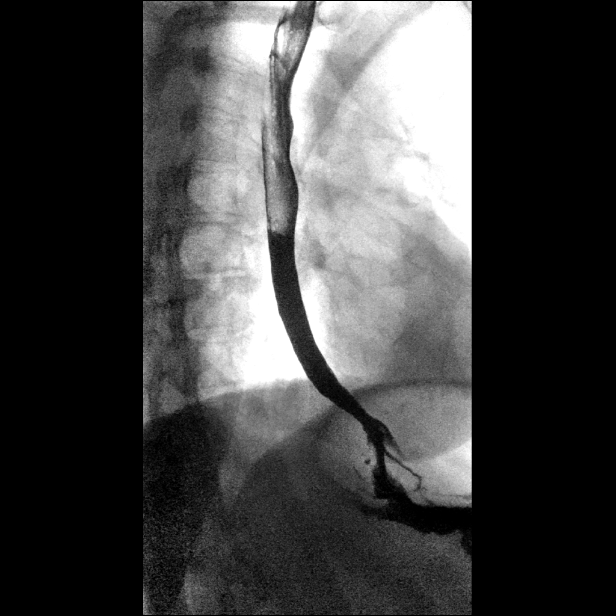

[Series 6: one shot · 2 of 2 slices shown]
[im 1/2]
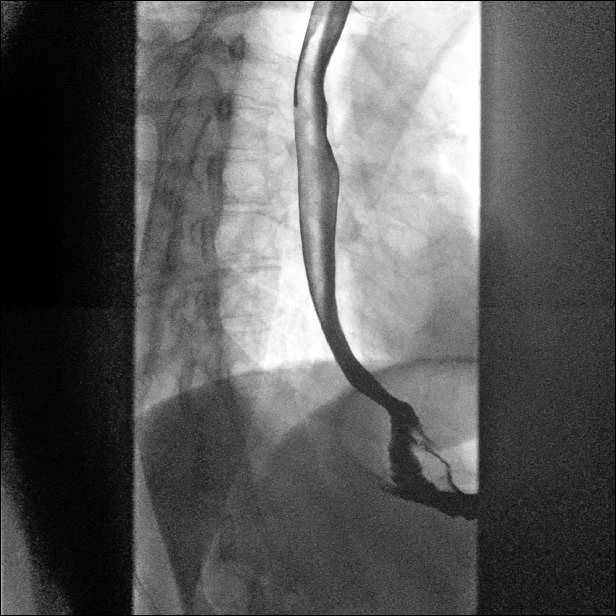
[im 2/2]
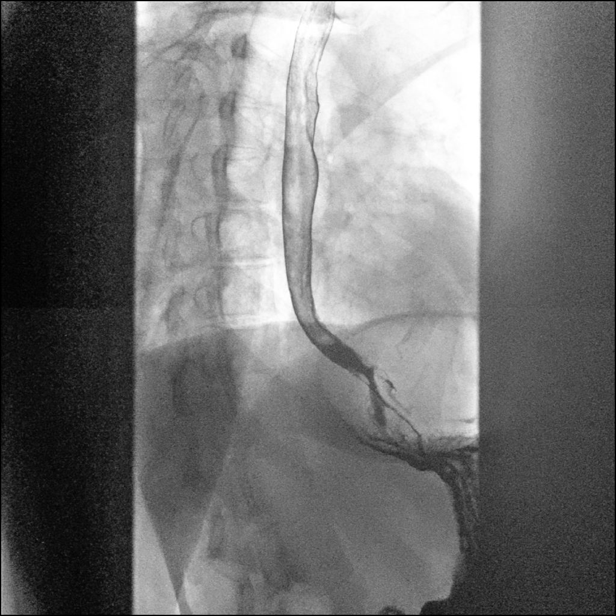

[Series 7: sequence · 1 of 36 frames shown (5 of 8)]
[frame 20/36]
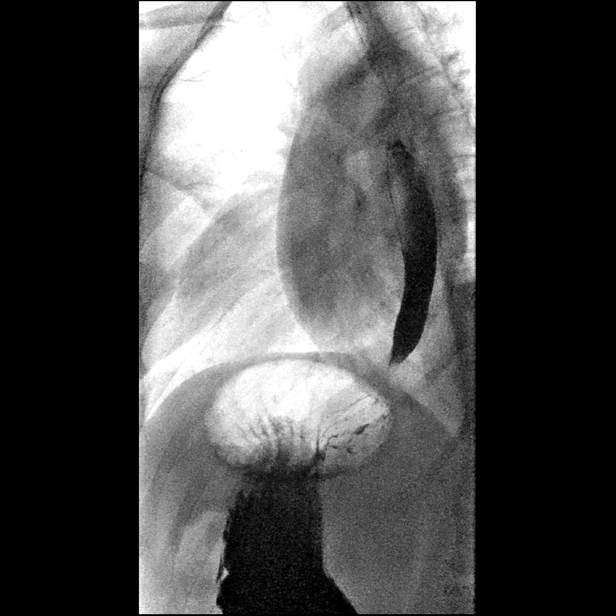

[Series 8: sequence · 1 of 30 frames shown (6 of 8)]
[frame 11/30]
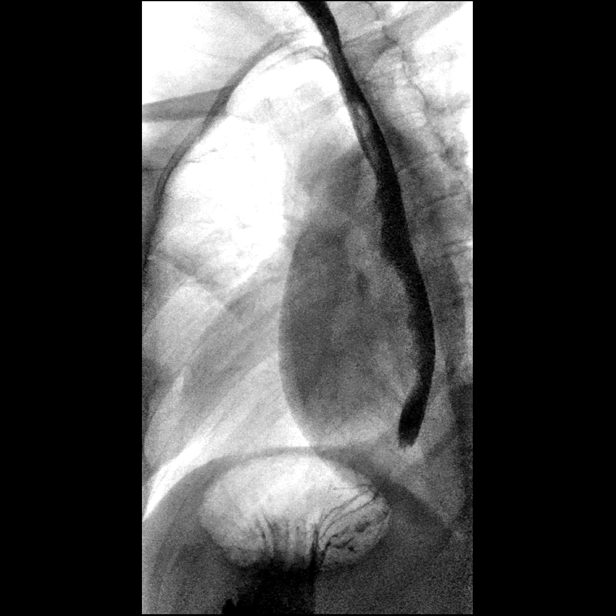

[Series 9: sequence · 2 of 38 frames shown (7 of 8)]
[frame 4/38]
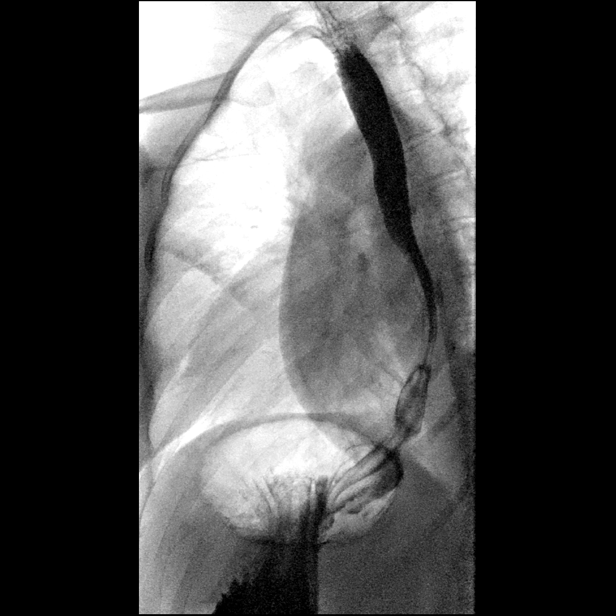
[frame 20/38]
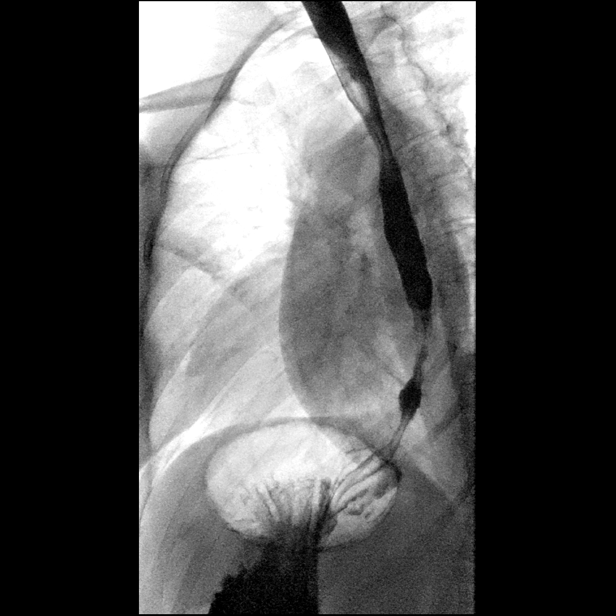

[Series 11: sequence · 2 of 30 frames shown (8 of 8)]
[frame 5/30]
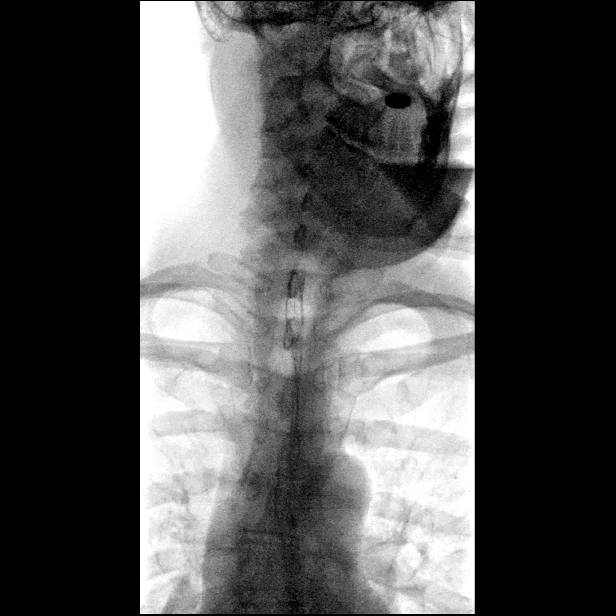
[frame 26/30]
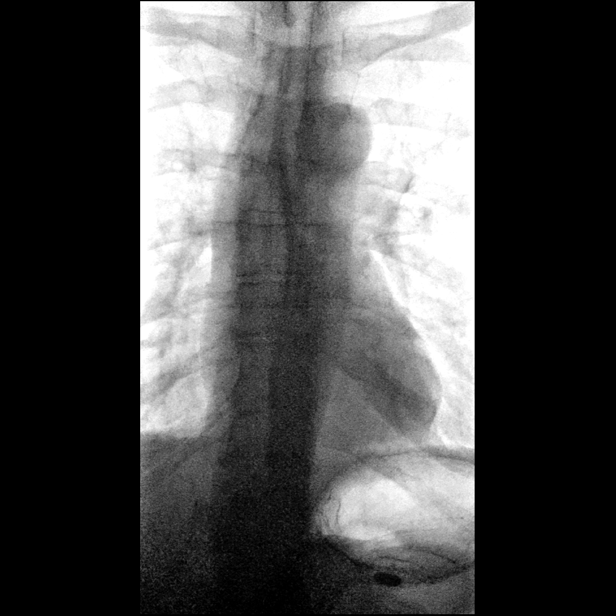

[14 of 24 positions shown; findings below may reference images not displayed]

FINDINGS: Swallowing: Appears normal. No vestibular penetration or aspiration
seen.

Pharynx: Unremarkable.

Esophagus: Normal appearance.

Esophageal motility: Within normal limits.

Hiatal Hernia: None.

Gastroesophageal reflux: None visualized.

Ingested 13mm barium tablet: Passed normally

Other: None.
IMPRESSION: Normal esophagram.

## 2024-02-20 ENCOUNTER — Ambulatory Visit: Admitting: Physician Assistant

## 2024-02-20 ENCOUNTER — Encounter: Payer: Self-pay | Admitting: Physician Assistant

## 2024-02-20 VITALS — BP 112/78 | HR 88

## 2024-02-20 DIAGNOSIS — L573 Poikiloderma of Civatte: Secondary | ICD-10-CM

## 2024-02-20 NOTE — Progress Notes (Signed)
   New Patient Visit   Subjective  Veronica Gates is a 48 y.o. female who presents for the following: rash  Pt got COVID in 2022 and after developed a rash on her chest, neck and arms. She was given triamcinolone but only used daily not twice a day. It didn't seem to work for her.  The following portions of the chart were reviewed this encounter and updated as appropriate: medications, allergies, medical history  Review of Systems:  No other skin or systemic complaints except as noted in HPI or Assessment and Plan.  Objective  Well appearing patient in no apparent distress; mood and affect are within normal limits.  A focused examination was performed of the following areas: Chest and arms  Relevant exam findings are noted in the Assessment and Plan.    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   Treatment Plan: Reassurance provided. Use sunscreen daily.   POIKILODERMA OF CIVATTE    Return if symptoms worsen or fail to improve.  I, Darice Smock, CMA, am acting as scribe for [employer], PA-C.   Documentation: I have reviewed the above documentation for accuracy and completeness, and I agree with the above.  Eastyn Skalla K, PA-C

## 2024-02-20 NOTE — Patient Instructions (Addendum)
 Poikiloderma of civatte  Important Information   Due to recent changes in healthcare laws, you may see results of your pathology and/or laboratory studies on MyChart before the doctors have had a chance to review them. We understand that in some cases there may be results that are confusing or concerning to you. Please understand that not all results are received at the same time and often the doctors may need to interpret multiple results in order to provide you with the best plan of care or course of treatment. Therefore, we ask that you please give us  2 business days to thoroughly review all your results before contacting the office for clarification. Should we see a critical lab result, you will be contacted sooner.     If You Need Anything After Your Visit   If you have any questions or concerns for your doctor, please call our main line at [phone]. If no one answers, please leave a voicemail as directed and we will return your call as soon as possible. Messages left after 4 pm will be answered the following business day.    You may also send us  a message via MyChart. We typically respond to MyChart messages within 1-2 business days.  For prescription refills, please ask your pharmacy to contact our office. Our fax number is [phone].  If you have an urgent issue when the clinic is closed that cannot wait until the next business day, you can page your doctor at the number below.     Please note that while we do our best to be available for urgent issues outside of office hours, we are not available 24/7.    If you have an urgent issue and are unable to reach us , you may choose to seek medical care at your doctor's office, retail clinic, urgent care center, or emergency room.   If you have a medical emergency, please immediately call 911 or go to the emergency department. In the event of inclement weather, please call our main line at [phone] for an update on the status of any  delays or closures.  Dermatology Medication Tips: Please keep the boxes that topical medications come in in order to help keep track of the instructions about where and how to use these. Pharmacies typically print the medication instructions only on the boxes and not directly on the medication tubes.   If your medication is too expensive, please contact our office at [phone] or send us  a message through MyChart.    We are unable to tell what your co-pay for medications will be in advance as this is different depending on your insurance coverage. However, we may be able to find a substitute medication at lower cost or fill out paperwork to get insurance to cover a needed medication.    If a prior authorization is required to get your medication covered by your insurance company, please allow us  1-2 business days to complete this process.   Drug prices often vary depending on where the prescription is filled and some pharmacies may offer cheaper prices.   The website www.goodrx.com contains coupons for medications through different pharmacies. The prices here do not account for what the cost may be with help from insurance (it may be cheaper with your insurance), but the website can give you the price if you did not use any insurance.  - You can print the associated coupon and take it with your prescription to the pharmacy.  - You may also stop by  our office during regular business hours and pick up a GoodRx coupon card.  - If you need your prescription sent electronically to a different pharmacy, notify our office through [facility] or by phone at [phone]

## 2024-05-01 ENCOUNTER — Other Ambulatory Visit: Payer: Self-pay | Admitting: Obstetrics and Gynecology

## 2024-05-02 LAB — SURGICAL PATHOLOGY
